# Patient Record
Sex: Female | Born: 1960
Health system: Southern US, Community
[De-identification: ages and names within clinical notes are randomized; demographics above are authoritative.]

## PROBLEM LIST (undated history)

## (undated) DIAGNOSIS — M1990 Unspecified osteoarthritis, unspecified site: Secondary | ICD-10-CM

## (undated) DIAGNOSIS — E669 Obesity, unspecified: Secondary | ICD-10-CM

## (undated) DIAGNOSIS — E079 Disorder of thyroid, unspecified: Secondary | ICD-10-CM

## (undated) DIAGNOSIS — G4730 Sleep apnea, unspecified: Secondary | ICD-10-CM

## (undated) DIAGNOSIS — E039 Hypothyroidism, unspecified: Secondary | ICD-10-CM

## (undated) DIAGNOSIS — E878 Other disorders of electrolyte and fluid balance, not elsewhere classified: Secondary | ICD-10-CM

## (undated) DIAGNOSIS — K219 Gastro-esophageal reflux disease without esophagitis: Secondary | ICD-10-CM

## (undated) HISTORY — DX: Disorder of thyroid, unspecified: E07.9

## (undated) HISTORY — DX: Sleep apnea, unspecified: G47.30

## (undated) HISTORY — DX: Unspecified osteoarthritis, unspecified site: M19.90

## (undated) HISTORY — DX: Obesity, unspecified: E66.9

---

## 1997-09-06 ENCOUNTER — Inpatient Hospital Stay (HOSPITAL_COMMUNITY): Admission: AD | Admit: 1997-09-06 | Discharge: 1997-09-06 | Payer: Self-pay | Admitting: Obstetrics and Gynecology

## 1997-09-13 ENCOUNTER — Inpatient Hospital Stay (HOSPITAL_COMMUNITY): Admission: AD | Admit: 1997-09-13 | Discharge: 1997-09-13 | Payer: Self-pay | Admitting: Obstetrics and Gynecology

## 1997-09-17 ENCOUNTER — Other Ambulatory Visit: Admission: RE | Admit: 1997-09-17 | Discharge: 1997-09-17 | Payer: Self-pay | Admitting: Obstetrics and Gynecology

## 1997-09-17 ENCOUNTER — Observation Stay (HOSPITAL_COMMUNITY): Admission: AD | Admit: 1997-09-17 | Discharge: 1997-09-17 | Payer: Self-pay | Admitting: Obstetrics and Gynecology

## 1997-10-02 ENCOUNTER — Ambulatory Visit (HOSPITAL_COMMUNITY): Admission: RE | Admit: 1997-10-02 | Discharge: 1997-10-02 | Payer: Self-pay | Admitting: Obstetrics and Gynecology

## 1997-10-08 ENCOUNTER — Inpatient Hospital Stay (HOSPITAL_COMMUNITY): Admission: AD | Admit: 1997-10-08 | Discharge: 1997-10-10 | Payer: Self-pay | Admitting: Obstetrics and Gynecology

## 1997-10-11 ENCOUNTER — Inpatient Hospital Stay (HOSPITAL_COMMUNITY): Admission: AD | Admit: 1997-10-11 | Discharge: 1997-10-11 | Payer: Self-pay | Admitting: Obstetrics and Gynecology

## 1998-03-20 ENCOUNTER — Inpatient Hospital Stay (HOSPITAL_COMMUNITY): Admission: AD | Admit: 1998-03-20 | Discharge: 1998-03-23 | Payer: Self-pay | Admitting: Obstetrics & Gynecology

## 1998-03-21 ENCOUNTER — Encounter: Payer: Self-pay | Admitting: Obstetrics & Gynecology

## 2003-01-23 ENCOUNTER — Ambulatory Visit (HOSPITAL_COMMUNITY): Admission: RE | Admit: 2003-01-23 | Discharge: 2003-01-23 | Payer: Self-pay | Admitting: Internal Medicine

## 2006-10-24 ENCOUNTER — Ambulatory Visit (HOSPITAL_COMMUNITY): Admission: RE | Admit: 2006-10-24 | Discharge: 2006-10-24 | Payer: Self-pay | Admitting: Internal Medicine

## 2007-09-28 ENCOUNTER — Other Ambulatory Visit: Admission: RE | Admit: 2007-09-28 | Discharge: 2007-09-28 | Payer: Self-pay | Admitting: Obstetrics and Gynecology

## 2008-04-29 ENCOUNTER — Ambulatory Visit (HOSPITAL_COMMUNITY): Admission: RE | Admit: 2008-04-29 | Discharge: 2008-04-29 | Payer: Self-pay | Admitting: Family Medicine

## 2008-10-03 ENCOUNTER — Other Ambulatory Visit: Admission: RE | Admit: 2008-10-03 | Discharge: 2008-10-03 | Payer: Self-pay | Admitting: Obstetrics and Gynecology

## 2009-01-29 ENCOUNTER — Ambulatory Visit (HOSPITAL_COMMUNITY): Admission: RE | Admit: 2009-01-29 | Discharge: 2009-01-29 | Payer: Self-pay | Admitting: Obstetrics & Gynecology

## 2009-01-31 ENCOUNTER — Emergency Department (HOSPITAL_COMMUNITY): Admission: EM | Admit: 2009-01-31 | Discharge: 2009-01-31 | Payer: Self-pay | Admitting: Emergency Medicine

## 2010-05-30 IMAGING — US US TRANSVAGINAL NON-OB
1 series · 14 of 25 positions shown · non-contrast
Comparison: None

CLINICAL DATA: Post menopausal bleeding.

TRANSABDOMINAL AND TRANSVAGINAL ULTRASOUND OF PELVIS
TECHNIQUE: Both transabdominal and transvaginal ultrasound
examinations of the pelvis were performed including evaluation of
the uterus, ovaries, adnexal regions, and pelvic cul-de-sac.

[Series 1: us transvaginal non-ob · 0.32mm/px · 14 of 29 slices shown]
[im 1/29]
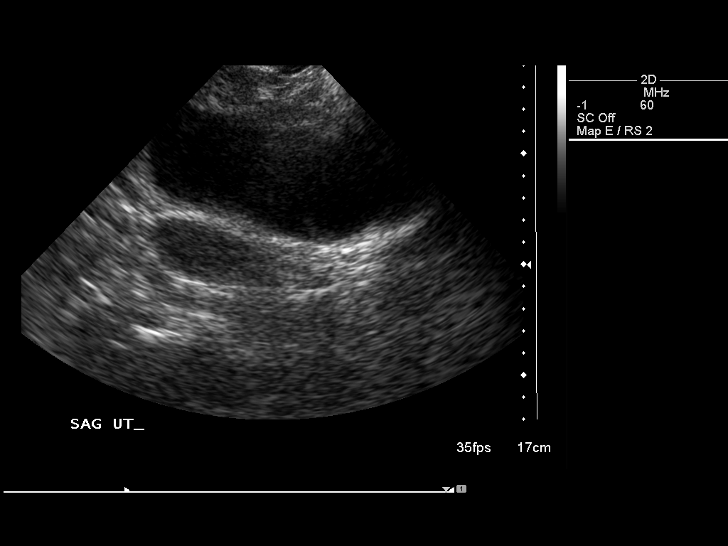
[im 3/29]
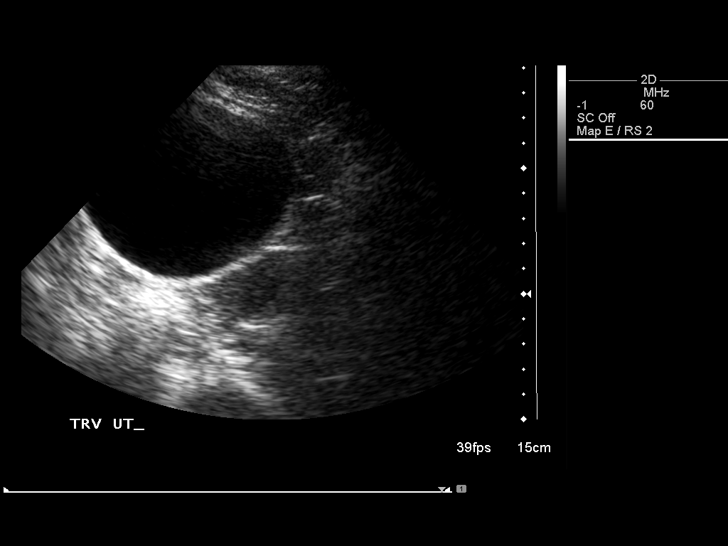
[im 5/29]
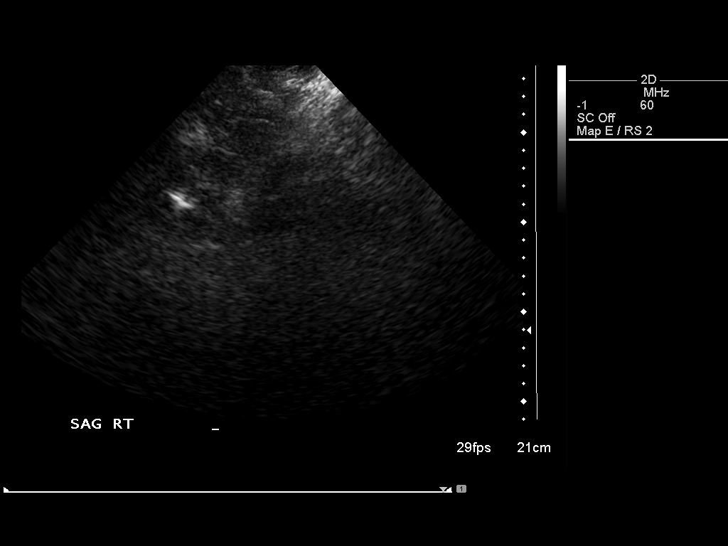
[im 8/29]
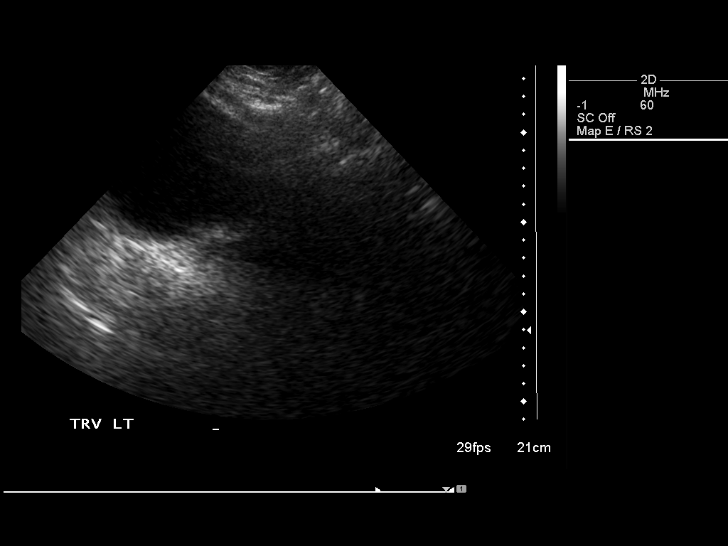
[im 10/29]
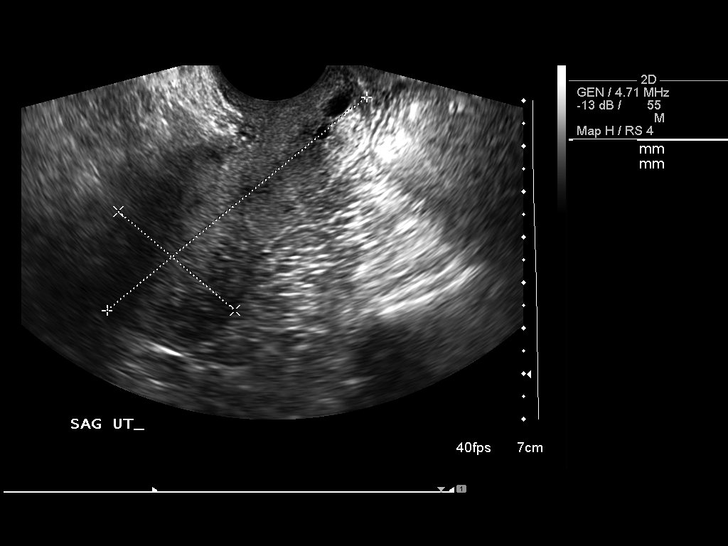
[im 11/29]
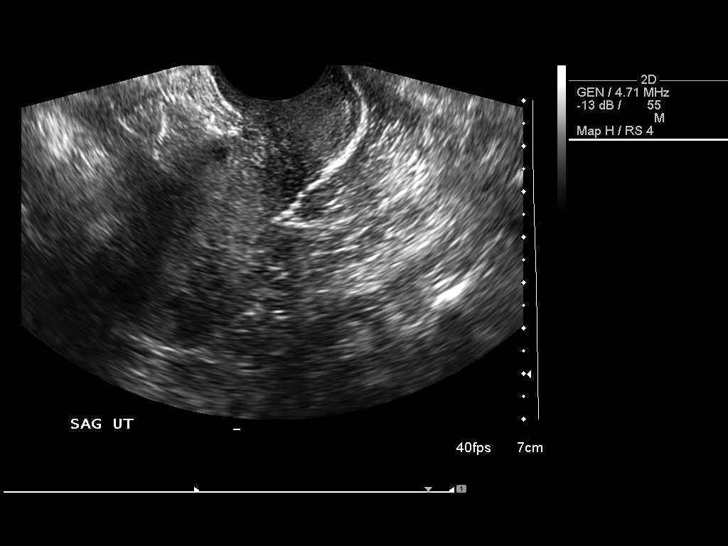
[im 13/29]
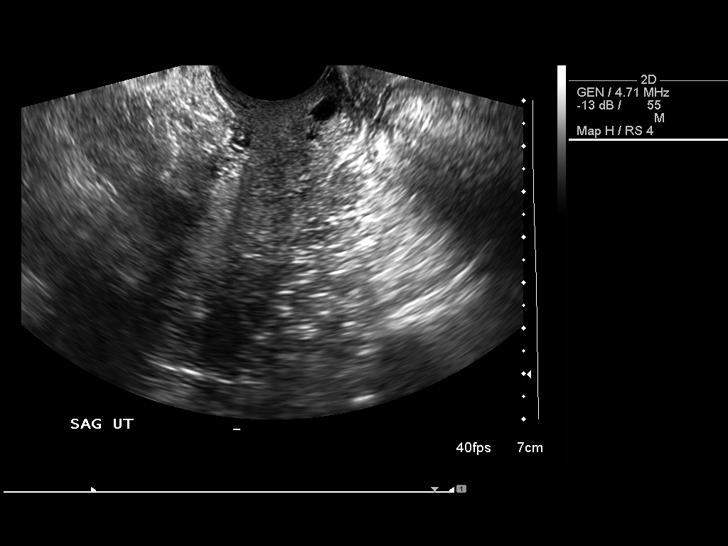
[im 16/29]
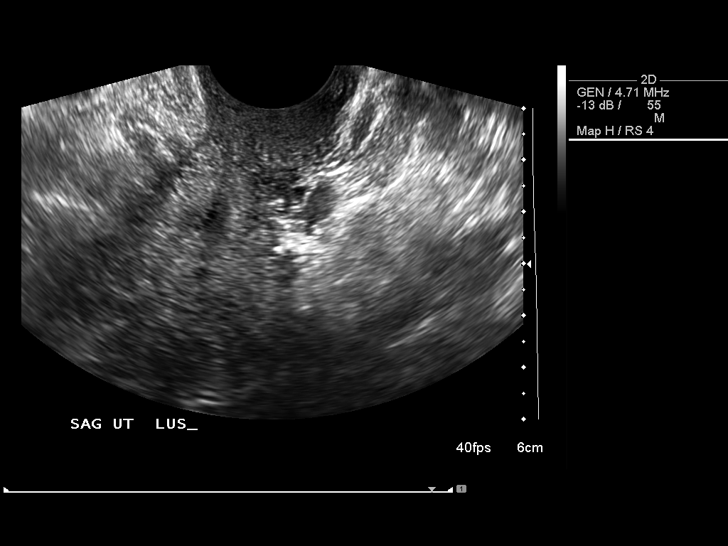
[im 18/29]
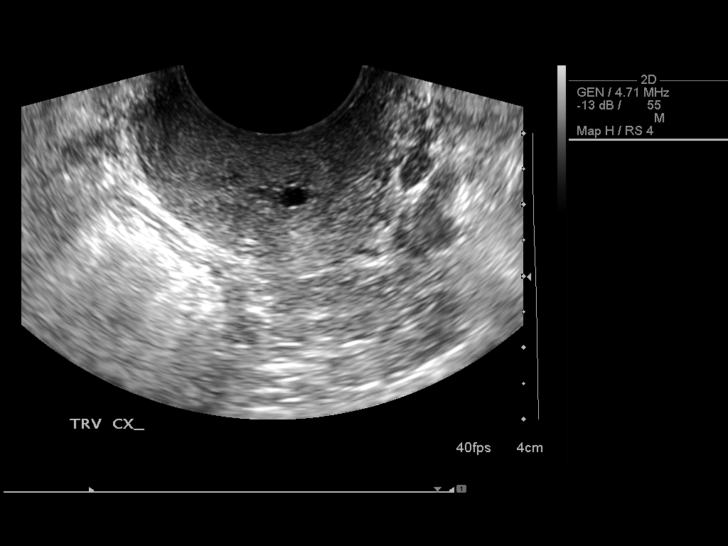
[im 19/29]
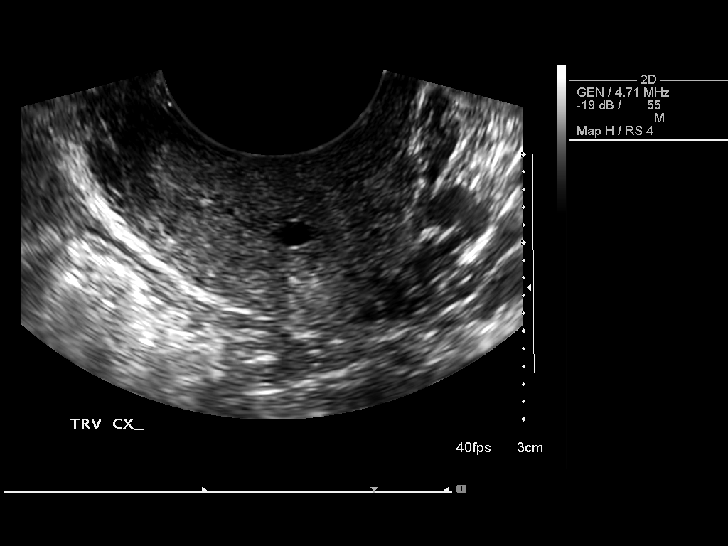
[im 22/29]
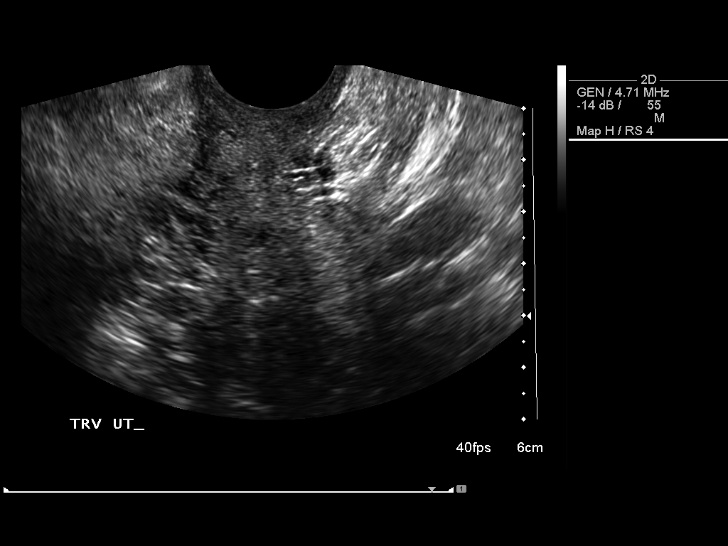
[im 24/29]
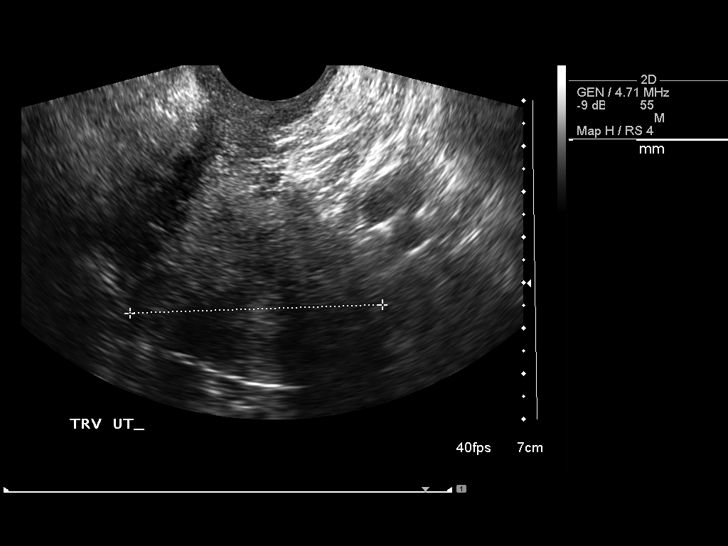
[im 26/29]
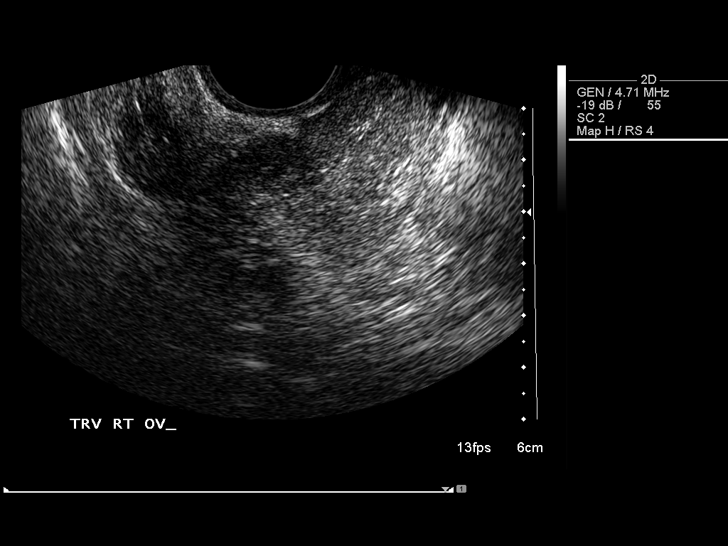
[im 29/29]
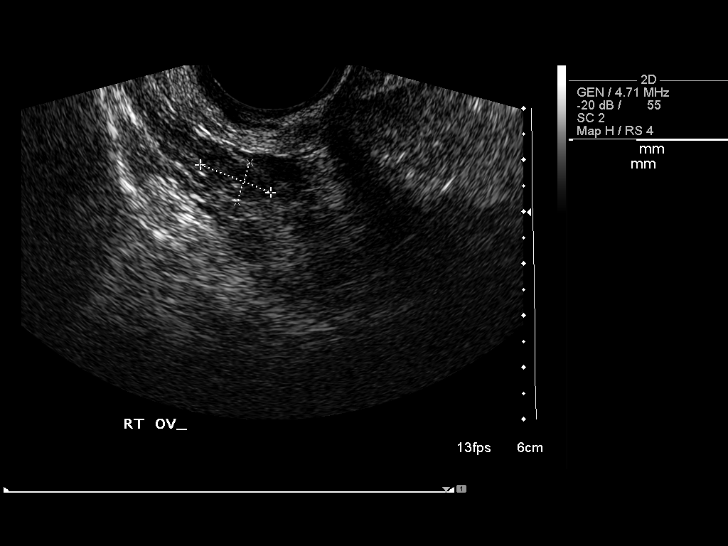

[14 of 25 positions shown; findings below may reference images not displayed]

FINDINGS: Uterus measures 7.4 cm in length and 3.4 x 5.5 cm transversely.
There is is retroverted.  Inhomogeneous myometrial echotexture but
no distinct uterine mass.

Endometrium measures 0.7 cm in thickness.  Small amount of fluid in
the endocervical canal incidentally.

Right Ovary measures 1.4 x 0.8 x 1.4 cm.  Limited visualization of
the right ovary.

Left Ovary cannot be visualized.

Other Findings:  Very small amount of free pelvic fluid.
IMPRESSION: Unremarkable uterus.  No thickening of the endometrial complex.
Negative right ovary.  Left ovary cannot be visualized.

## 2010-09-23 ENCOUNTER — Other Ambulatory Visit: Payer: Self-pay | Admitting: Adult Health

## 2010-09-23 ENCOUNTER — Other Ambulatory Visit: Payer: Self-pay | Admitting: Obstetrics & Gynecology

## 2010-09-23 ENCOUNTER — Other Ambulatory Visit (HOSPITAL_COMMUNITY)
Admission: RE | Admit: 2010-09-23 | Discharge: 2010-09-23 | Disposition: A | Discharge status: Home / Self Care | Payer: 59 | Source: Ambulatory Visit | Attending: Obstetrics and Gynecology | Admitting: Obstetrics and Gynecology

## 2010-09-23 DIAGNOSIS — N950 Postmenopausal bleeding: Secondary | ICD-10-CM

## 2010-09-23 DIAGNOSIS — Z01419 Encounter for gynecological examination (general) (routine) without abnormal findings: Secondary | ICD-10-CM | POA: Insufficient documentation

## 2010-09-25 ENCOUNTER — Other Ambulatory Visit: Payer: Self-pay | Admitting: Obstetrics & Gynecology

## 2010-09-25 ENCOUNTER — Other Ambulatory Visit (HOSPITAL_COMMUNITY): Payer: Self-pay

## 2010-09-25 ENCOUNTER — Ambulatory Visit (HOSPITAL_COMMUNITY)
Admission: RE | Admit: 2010-09-25 | Discharge: 2010-09-25 | Disposition: A | Discharge status: Home / Self Care | Payer: 59 | Source: Ambulatory Visit | Attending: Obstetrics & Gynecology | Admitting: Obstetrics & Gynecology

## 2010-09-25 DIAGNOSIS — R109 Unspecified abdominal pain: Secondary | ICD-10-CM | POA: Insufficient documentation

## 2010-09-25 DIAGNOSIS — N950 Postmenopausal bleeding: Secondary | ICD-10-CM | POA: Insufficient documentation

## 2011-01-26 ENCOUNTER — Encounter: Payer: Self-pay | Admitting: Orthopedic Surgery

## 2011-01-26 ENCOUNTER — Ambulatory Visit (INDEPENDENT_AMBULATORY_CARE_PROVIDER_SITE_OTHER): Payer: 59 | Admitting: Orthopedic Surgery

## 2011-01-26 VITALS — BP 120/72 | Ht 66.0 in | Wt 327.0 lb

## 2011-01-26 DIAGNOSIS — M5020 Other cervical disc displacement, unspecified cervical region: Secondary | ICD-10-CM

## 2011-01-26 MED ORDER — GABAPENTIN 100 MG PO CAPS: 300.0000 mg | ORAL_CAPSULE | Freq: Every day | ORAL | Status: DC

## 2011-01-26 NOTE — Patient Instructions (Signed)
CONTINUE MELOXICAM   START PT X 6 WEEKS   START NEURONTIN 3 TABS AT NIGHT

## 2011-01-26 NOTE — Progress Notes (Signed)
Chief complaint pain in the right arm and index finger radiating down the arm  A 50 year old female complains of pain in her arm for 4 months gradual onset sharp 8/10 in severity.  Denies any trauma.  Associated with numbness and tingling no previous treatment.  We will take an x-ray here.  Past Medical History  Diagnosis Date  . Thyroid disease   . Diabetes mellitus   . Arthritis     History reviewed. No pertinent past surgical history.  Review of systems snoring, numbness, tingling, temperature intolerance  Physical Exam(12) GENERAL: normal development   CDV: pulses are normal   Skin: normal  Lymph: nodes were not palpable/normal  Psychiatric: awake, alert and oriented  Neuro: normal sensation  MSK c spine mild tenderness  1 RIGHT upper extremity evaluation no tenderness normal range of motion in all joints.  Shoulder elbow wrist and hand are stable.  No strength deficit.  Skin is normal.  Pulse and temperature normal.  No lymphadenopathy. 2 Cervical spine mild tenderness with mild motion deficits on rotation.  Cervical spine x-ray shows loss of cervical lordosis without joint space narrowing or osteophyte formation  Impression normal cervical spine with loss of cervical lordosis Assessment: Probable herniated disc no protruding disc    Plan: Medical management   Separate x-ray report AP and lateral cervical spine for pain in the RIGHT upper extremity.  Findings the cervical spine has lost its normal contour. There are large anterior osteophytes at C4 inferior border of C5 as well as C3. C4-C5 of the wrist areas. There is also an anterior ossicle at C5 and 6.  Impression cervical disc disease

## 2011-02-01 ENCOUNTER — Encounter: Payer: Self-pay | Admitting: Orthopedic Surgery

## 2011-02-01 DIAGNOSIS — M5020 Other cervical disc displacement, unspecified cervical region: Secondary | ICD-10-CM | POA: Insufficient documentation

## 2011-02-03 ENCOUNTER — Ambulatory Visit (HOSPITAL_COMMUNITY)
Admission: RE | Admit: 2011-02-03 | Discharge: 2011-02-03 | Disposition: A | Discharge status: Home / Self Care | Payer: 59 | Source: Ambulatory Visit | Attending: Orthopedic Surgery | Admitting: Orthopedic Surgery

## 2011-02-03 DIAGNOSIS — M25519 Pain in unspecified shoulder: Secondary | ICD-10-CM | POA: Insufficient documentation

## 2011-02-03 DIAGNOSIS — IMO0001 Reserved for inherently not codable concepts without codable children: Secondary | ICD-10-CM | POA: Insufficient documentation

## 2011-02-03 DIAGNOSIS — M542 Cervicalgia: Secondary | ICD-10-CM | POA: Insufficient documentation

## 2011-02-03 DIAGNOSIS — E119 Type 2 diabetes mellitus without complications: Secondary | ICD-10-CM | POA: Insufficient documentation

## 2011-02-03 NOTE — Progress Notes (Signed)
Physical Therapy Evaluation  Patient Details  Name: Angela Mccarty MRN: [id number] Date of Birth: 1960/05/01  Today's Date: 02/03/2011 Time: 2130-8657 Time Calculation (min): 47 min Charges: 1 eval Visit#: 1  of 8   Re-eval: 03/05/11 Assessment Diagnosis: cervical pain/R arm pain Next MD Visit: 03/17/10 Prior Therapy: None  Past Medical History:  Past Medical History  Diagnosis Date  . Thyroid disease   . Diabetes mellitus   . Arthritis    Past Surgical History: No past surgical history on file.  Subjective Symptoms/Limitations Symptoms: Pt is a 50 year old female who is referred to PT secondary to neck pain which is causing R arm weakness and pain.  Pt reports that it started about 4-5 months ago when she was working at [employer].  Her c/co is radicular pain to her first finger.  She reports that she went to the onsite nurse who diagnosed her with tendonitis and gave her bio-freeze and ibuprofen and sent her on her way.  She decided to seek advice from her PCP Dr. Phillips Odor who then referred her to Dr. Romeo Apple.  She reports that her job requires lifting boxes all day which weight about 10-15lbs and pulling heavy equipment.  She works 6-7 days a week for 8 hours a day.  She works 1st shift. She is taking Tylenol to help her pain without much relief.  She reports that Dr. Romeo Apple prescribed her medication to help relieve her nerve pain. Aggravating Symptoms: Worse at night when she is sleeping on her side, and when she first starts at work.  Alleviating Symptoms: Laying flat on her back.  She reports that most of her pain in on the right side of her neck with some midline tenderness.  Pt reports she is right handed.  Denies nausea, vomiting or dizziness. PMH: DM II, obesity Limitations: Sitting;Reading Special Tests: - Bakody's + Compression, + Distraction.  Pain Assessment Currently in Pain?: Yes Pain Score:   7 Pain Location: Neck Pain Orientation: Right Pain Type: Acute  pain   02/03/11 1700 Assessment Diagnosis cervical pain/R arm pain Next MD Visit 03/17/10 Prior Therapy None Home Living Lives With Spouse;Daughter Prior Function Level of Independence Independent with basic ADLs;Independent with gait Driving Yes Vocation Full time employment Vocation Requirements 6-7 days a week for 40 hours  Leisure Hobbies-yes (Comment) Comments Enjoys singing, playing on her iPad,  Palpation: Increaed pain and tenderness with significant spasm to R UT and scalene musculature and L UT.  RUE AROM (degrees) Right Shoulder Flexion  0-170 150 Degrees Right Shoulder ABduction 0-140 90 Degrees Right Shoulder Internal Rotation  0-70 70 Degrees Right Shoulder External Rotation  0-90 90 Degrees RUE Strength RUE Overall Strength Comments Strength 4/5 for shoulder motions, except 3/5 for R rhomboid  Cervical AROM Cervical Flexion WNL - mild pain Cervical - Right Side Bend 15 cm - increased pain Cervical - Left Side Bend 14 cm - no pain Cervical - Right Rotation 17 cm - increased pain Cervical - Left Rotation 12 cm - no pain  Cervical Strength Overall Cervical - all cervical strength was strong and painless  Capital flexion   4/5  Cervical Flexion 4/5 Cervical Extension 5/5 Cervical - Right Side Bend 3+/5 Cervical - Left Side Bend 4/5 Cervical - Right Rotation 4/5 Cervical - Left Rotation 3+/5 Posture/Postural Control Posture/Postural Control Postural limitations Postural Limitations slouched posture.  Upper cross syndrome.    Exercise/Treatments Stretches Upper Trapezius Stretch: 30 seconds Levator Stretch: 30 seconds Corner Stretch: 30 seconds  Neck Exercises Neck Retraction: 10 reps Scapular Retraction: 10 reps  Physical Therapy Assessment and Plan PT Assessment and Plan Clinical Impression Statement: Pt is a 50 year old female referred to PT secondary to neck pain with RUE radiculopathy.  After examination it was found that she has current body structure  impairrments including increaed R UE numbess, increaed pain, decreased cervical ROM, decreased cervical and R shoulder strength, increased cervical  and scapular spasms and impaired posture which are limiting her ability to participate in household and work related activities.  She will benefit from skilled OPPT in order to address the above impairments in order to maximize independence.  Rehab Potential: Good PT Frequency: Min 2X/week PT Duration: 6 weeks PT Treatment/Interventions: Therapeutic exercise;Neuromuscular re-education;Patient/family education;Other (comment) (Modalities and manual techniques to decrease pain.) PT Plan: Add: UBE for posture, t-band, x-v, w back, manual as needed for pain control, Cervical traction    Goals Home Exercise Program Pt will Perform Home Exercise Program: Independently PT Short Term Goals Time to Complete Short Term Goals: 2 weeks PT Short Term Goal 1: Pt will report pain less than or equal to 4/10 for 50% of the day.  PT Short Term Goal 2: Pt will report sleeping 5-6 hours without waking from pain.  PT Short Term Goal 3: Pt will report a 50% decrease raidicular pain to RUE.  PT Short Term Goal 4: Pt will present with mild spasms to scapular region.  PT Short Term Goal 5: Pt will improve shoulder ROM to WNL.  PT Long Term Goals Time to Complete Long Term Goals: Other (comment) (6 weeks) PT Long Term Goal 1: Pt will report pain less than or equal to 2/10 for 75% of her day while at work.  PT Long Term Goal 2: Pt will improve her cervical ROM to WNL in order to safely turn her head while driving.  Long Term Goal 3: Pt will report a 75% decrease in radicular pain to R UE for improved quality of life.  Long Term Goal 4: Pt will improve postural strength/cervical strength to WNL in order to sit with appropriate posture during an entire therapy regime without cueing.   Problem List Patient Active Problem List  Diagnoses  . HNP (herniated nucleus pulposus),  cervical  . Cervical pain    PT - End of Session Activity Tolerance: Patient tolerated treatment well   Liviana Mills 02/03/2011, 6:31 PM  Physician Documentation Your signature is required to indicate approval of the treatment plan as stated above.  Please sign and either send electronically or make a copy of this report for your files and return this physician signed original.   Please mark one 1.__approve of plan  2. ___approve of plan with the following conditions.   ______________________________                                                          _____________________ Physician Signature  Date  

## 2011-02-08 NOTE — Progress Notes (Signed)
*  Letter of Medical Necessity sent on 02/08/11 via fax.*   To: [employer] Fax: [phone] ATTN: Thurston Hole  From: Annett Fabian, PT [facility] Phone: [phone] Fax: [phone] Re: Letter of Medical Necessity   To whom it may concern:  Ms. Angela Mccarty received an initial evaluation for physical therapy on 02/03/11 in regards to a herniated cervical disc w/RUE radiculopathy.  After examination it was found that she has current body structure impairments including increased R UE numbness, increased pain, decreased cervical ROM, decreased cervical and R shoulder strength, increased cervical  and scapular spasms and impaired posture which are limiting her ability to participate in household and work related activities.  She will benefit from skilled OPPT in order to address the above impairments in order to improve her overall function at home and with work related activities.   If you have any questions, please call: [phone].  Thank you, Annett Fabian, PT

## 2011-02-09 ENCOUNTER — Ambulatory Visit (HOSPITAL_COMMUNITY)
Admission: RE | Admit: 2011-02-09 | Discharge: 2011-02-09 | Disposition: A | Discharge status: Home / Self Care | Payer: 59 | Source: Ambulatory Visit | Attending: Physical Therapy | Admitting: Physical Therapy

## 2011-02-09 NOTE — Progress Notes (Signed)
Physical Therapy Treatment Patient Details  Name: Angela Mccarty MRN: [id number] Date of Birth: 07-Aug-1960  Today's Date: 02/09/2011 Time: 2130-8657 Time Calculation (min): 26 min Visit#: 2  of 8   Re-eval: 03/05/11 Charges:  therex 24'    Subjective: Symptoms/Limitations Symptoms: Pt. reports she has not had any pain for a couple of days.  Reports compliance with HEP. Pain Assessment Currently in Pain?: No/denies   Exercise/Treatments Stretches Upper Trapezius Stretch: 3 reps;30 seconds Levator Stretch: 3 reps;30 seconds Corner Stretch: 3 reps;30 seconds Neck Exercises Neck Retraction: 15 reps Shoulder Extension: 10 reps;Theraband Theraband Level (Shoulder Extension): Level 3 (Green) Row: 10 reps;Theraband Theraband Level (Row): Level 3 (Green) Scapular Retraction: 10 reps;Theraband Theraband Level (Scapular Retraction): Level 3 (Green) W Back: 10 reps X to V: 10 reps  Physical Therapy Assessment and Plan PT Assessment and Plan Clinical Impression Statement: Pt. displays good for with all previously instructed exercises.  Required cues to perform tband exercises correctly.  Overall progressing well with decreased pain.  Explained to patient we would add modalities (i.e. traction) as needed according to pain. PT Plan: Progress reps with therex; continue to progress posture.     Problem List Patient Active Problem List  Diagnoses  . HNP (herniated nucleus pulposus), cervical  . Cervical pain    PT - End of Session Activity Tolerance: Patient tolerated treatment well General Behavior During Session: [facility] for tasks performed Cognition: [facility] for tasks performed  Emeline Gins B 02/09/2011, 5:13 PM

## 2011-02-11 ENCOUNTER — Ambulatory Visit (HOSPITAL_COMMUNITY)
Admission: RE | Admit: 2011-02-11 | Discharge: 2011-02-11 | Disposition: A | Discharge status: Home / Self Care | Payer: 59 | Source: Ambulatory Visit | Attending: Orthopedic Surgery | Admitting: Orthopedic Surgery

## 2011-02-11 NOTE — Progress Notes (Signed)
Physical Therapy Treatment Patient Details  Name: Angela Mccarty MRN: [id number] Date of Birth: 1960-05-05  Today's Date: 02/11/2011 Time: 4098-1191 Time Calculation (min): 27 min Visit#: 3  of 8   Re-eval: 03/05/11 Charges:  therex 25'    Subjective: Symptoms/Limitations Symptoms: Pain free today. Pain Assessment Currently in Pain?: No/denies  Exercise/Treatments Stretches Upper Trapezius Stretch: Other (comment) (HEP) Levator Stretch: Other (comment) (HEP) Corner Stretch: 3 reps;30 seconds Neck Exercises Neck Retraction: 15 reps Shoulder Extension: 15 reps Theraband Level (Shoulder Extension): Level 3 (Green) Row: 15 reps Theraband Level (Row): Level 3 (Green) Scapular Retraction: 15 reps Theraband Level (Scapular Retraction): Level 3 (Green) W Back: 15 reps X to V: 15 reps Additional Neck Exercises Wall Pushups/Modified Pushups: 15 reps UBE (Upper Arm Bike): 4' @ 2.0 backwards     Physical Therapy Assessment and Plan PT Assessment and Plan Clinical Impression Statement: Pt. given tband and written instructions for HEP.  Pt. with improved form with tband exercises today.  Able to increase reps/added wall push-ups without difficulty. PT Plan: Discuss progression with PT; Continue per POC     Problem List Patient Active Problem List  Diagnoses  . HNP (herniated nucleus pulposus), cervical  . Cervical pain    PT - End of Session Activity Tolerance: Patient tolerated treatment well General Behavior During Session: [facility] for tasks performed Cognition: [facility] for tasks performed  Emeline Gins B 02/11/2011, 5:24 PM

## 2011-02-16 ENCOUNTER — Ambulatory Visit (HOSPITAL_COMMUNITY)
Admission: RE | Admit: 2011-02-16 | Discharge: 2011-02-16 | Disposition: A | Discharge status: Home / Self Care | Payer: 59 | Source: Ambulatory Visit | Attending: Orthopedic Surgery | Admitting: Orthopedic Surgery

## 2011-02-16 DIAGNOSIS — M25519 Pain in unspecified shoulder: Secondary | ICD-10-CM | POA: Insufficient documentation

## 2011-02-16 DIAGNOSIS — M542 Cervicalgia: Secondary | ICD-10-CM | POA: Insufficient documentation

## 2011-02-16 DIAGNOSIS — IMO0001 Reserved for inherently not codable concepts without codable children: Secondary | ICD-10-CM | POA: Insufficient documentation

## 2011-02-16 DIAGNOSIS — E119 Type 2 diabetes mellitus without complications: Secondary | ICD-10-CM | POA: Insufficient documentation

## 2011-02-16 NOTE — Progress Notes (Signed)
Physical Therapy Treatment Patient Details  Name: Angela Mccarty MRN: [id number] Date of Birth: 01-May-1960  Today's Date: 02/16/2011 Time: 4098-1191 Time Calculation (min): 38 min Visit#: 4  of 8   Re-eval: 03/05/11  Charge: therex 34 min  Subjective: Symptoms/Limitations Symptoms: feeling much better, pain free today. Pain Assessment Currently in Pain?: No/denies  Objective:   Exercise/Treatments Stretches Upper Trapezius Stretch: 1 rep;30 seconds;Limitations Upper Trapezius Stretch Limitations: reviewed proper tech Neck Exercises Neck Retraction: 15 reps Neck Flexion: Limitations Neck Flexion Limitations: chin tuck head lift prone Shoulder Extension: Prone;10 reps Shoulder Horizontal ABduction: Prone;15 reps Row: Prone;10 reps Scapular Retraction: Prone;10 reps W Back: Prone;10 reps X to V: 15 reps Upper Extremity Lift: Limitations;Seated (Money exercise for posture/ shoulder ER 10 reps) Additional Neck Exercises Wall Pushups/Modified Pushups: 15 reps UBE (Upper Arm Bike): 4' @ 2.0 backwards     Physical Therapy Assessment and Plan PT Assessment and Plan Clinical Impression Statement: Progressed therex to prone for postural strengthening with pillow under hips, pt able to perform all activities with min cueing for proper tech.  Pt required cueing for proper form wtih upper trap stretch.  Reviewed goals secondary to pt. stating she was doing so good,.  Pt reported no pain during full days at work, able to sleep with no awakening , no radicular pain, no palpable scapular spasms.  Slight impairment with ROM and posture. PT Plan: Continue per POC, assess possible early re-eval with PT secondary to doing so good.    Goals Home Exercise Program Pt will Perform Home Exercise Program: Independently PT Short Term Goals Time to Complete Short Term Goals: 2 weeks PT Short Term Goal 1: Pt will report pain less than or equal to 4/10 for 50% of the day.  PT Short Term Goal 1 -  Progress: Met PT Short Term Goal 2: Pt will report sleeping 5-6 hours without waking from pain.  PT Short Term Goal 2 - Progress: Met PT Short Term Goal 3: Pt will report a 50% decrease raidicular pain to RUE.  PT Short Term Goal 3 - Progress: Met PT Short Term Goal 4: Pt will present with mild spasms to scapular region.  PT Short Term Goal 4 - Progress: Met PT Short Term Goal 5: Pt will improve shoulder ROM to WNL.  PT Short Term Goal 5 - Progress: Progressing toward goal PT Long Term Goals Time to Complete Long Term Goals: Other (comment) (6 weeks) PT Long Term Goal 1: Pt will report pain less than or equal to 2/10 for 75% of her day while at work.  PT Long Term Goal 1 - Progress: Met PT Long Term Goal 2: Pt will improve her cervical ROM to WNL in order to safely turn her head while driving.  PT Long Term Goal 2 - Progress: Progressing toward goal Long Term Goal 3: Pt will report a 75% decrease in radicular pain to R UE for improved quality of life.  Long Term Goal 3 Progress: Met Long Term Goal 4: Pt will improve postural strength/cervical strength to WNL in order to sit with appropriate posture during an entire therapy regime without cueing.  Long Term Goal 4 Progress: Progressing toward goal  Problem List Patient Active Problem List  Diagnoses  . HNP (herniated nucleus pulposus), cervical  . Cervical pain    PT - End of Session Activity Tolerance: Patient tolerated treatment well General Behavior During Session: [facility] for tasks performed Cognition: [facility] for tasks performed  Angela Mccarty 02/16/2011, 6:11  PM

## 2011-02-18 ENCOUNTER — Ambulatory Visit (HOSPITAL_COMMUNITY)
Admission: RE | Admit: 2011-02-18 | Discharge: 2011-02-18 | Disposition: A | Discharge status: Home / Self Care | Payer: 59 | Source: Ambulatory Visit | Attending: Family Medicine | Admitting: Family Medicine

## 2011-02-18 NOTE — Progress Notes (Signed)
Physical Therapy Treatment Patient Details  Name: Angela Mccarty MRN: [id number] Date of Birth: 11/09/1960  Today's Date: 02/18/2011 Time: 7829-5621 Time Calculation (min): 34 min Visit#: 5  of 8   Re-eval: 02/26/11 Charges:  therex 33'    Subjective: Symptoms/Limitations Symptoms: Pt is pain free. Pain Assessment Currently in Pain?: No/denies Pain Score: 0-No pain   Exercise/Treatments Stretches Upper Trapezius Stretch: 30 seconds;2 reps Corner Stretch: 3 reps;30 seconds Neck Exercises Neck Retraction: 15 reps;Limitations Neck Retraction Limitations: prone, chin tucks/lifts Shoulder Extension: Prone;10 reps Shoulder Horizontal ABduction: Prone;15 reps Row: Prone;10 reps Scapular Retraction: Prone;10 reps W Back: Prone;10 reps X to V: 15 reps Upper Extremity Lift: Limitations;Seated Additional Neck Exercises Wall Pushups/Modified Pushups: 20 reps UBE (Upper Arm Bike): 4' @ 2.0 backwards    Physical Therapy Assessment and Plan PT Assessment and Plan Clinical Impression Statement: Pt. continues to do well with therapy and reports compliance with HEP.  Able to perform prone exercises with proper positioning without difficulty. PT Plan: Re-eval next week.    Problem List Patient Active Problem List  Diagnoses  . HNP (herniated nucleus pulposus), cervical  . Cervical pain    PT - End of Session Activity Tolerance: Patient tolerated treatment well General Behavior During Session: [facility] for tasks performed Cognition: [facility] for tasks performed  Emeline Gins B 02/18/2011, 5:30 PM

## 2011-02-23 ENCOUNTER — Ambulatory Visit (HOSPITAL_COMMUNITY): Payer: 59 | Admitting: Physical Therapy

## 2011-02-25 ENCOUNTER — Ambulatory Visit (HOSPITAL_COMMUNITY)
Admission: RE | Admit: 2011-02-25 | Discharge: 2011-02-25 | Disposition: A | Discharge status: Home / Self Care | Payer: 59 | Source: Ambulatory Visit | Attending: Family Medicine | Admitting: Family Medicine

## 2011-02-25 NOTE — Progress Notes (Signed)
Physical Therapy Treatment Patient Details  Name: Angela Mccarty MRN: [id number] Date of Birth: 13-Oct-1960  Today's Date: 02/25/2011 Time: 1308-6578 Time Calculation (min): 30 min Visit#: 6  of 8   Re-eval: 02/26/11  Charge: therex 24 min  Subjective: Symptoms/Limitations Symptoms: I'm feeling good, pain free still. Pain Assessment Currently in Pain?: No/denies  Objective:   Exercise/Treatments Stretches Upper Trapezius Stretch: 3 reps;30 seconds Corner Stretch: 3 reps;30 seconds Neck Exercises Neck Retraction: 15 reps;Limitations Neck Retraction Limitations: prone: chin tuck/ head lift Shoulder Extension: Prone;15 reps;Bar weights/barbell Bar Weights/Barbell (Shoulder Extension): 2 lbs Shoulder Horizontal ABduction: Prone;15 reps;Bar weights/barbell;Seated;Both;Limitations Bar Weights/Barbell (Shoulder Horizontal Abduction): 2 lbs Shoulder Horizontal Abduction Limitations: Money motion 20 reps seated Row: Prone;15 reps;Limitations Row Limitations: 2# Scapular Retraction: Prone;15 reps;Limitations Scapular Retraction Limitations: 2# W Back: Prone;15 reps;Weight W Back Weights (lbs): 2# X to V: 20 reps Additional Neck Exercises Wall Pushups/Modified Pushups: 20 reps UBE (Upper Arm Bike): 6' @ 3.0 Plank: 5x 10"     Physical Therapy Assessment and Plan PT Assessment and Plan Clinical Impression Statement: Pt continue to show good form with all therex activities.  Added plank for postural strengthening, pt able to complete with visible UE fatigue and min cueing for proper form.   PT Plan: Re-assess next session for possible D/C to HEP.    Goals Home Exercise Program Pt will Perform Home Exercise Program: Independently PT Short Term Goals Time to Complete Short Term Goals: 2 weeks PT Short Term Goal 1: Pt will report pain less than or equal to 4/10 for 50% of the day.  PT Short Term Goal 2: Pt will report sleeping 5-6 hours without waking from pain.  PT Short  Term Goal 3: Pt will report a 50% decrease raidicular pain to RUE.  PT Short Term Goal 4: Pt will present with mild spasms to scapular region.  PT Short Term Goal 5: Pt will improve shoulder ROM to WNL.  PT Long Term Goals Time to Complete Long Term Goals: Other (comment) (6 weeks) PT Long Term Goal 1: Pt will report pain less than or equal to 2/10 for 75% of her day while at work.  PT Long Term Goal 2: Pt will improve her cervical ROM to WNL in order to safely turn her head while driving.  Long Term Goal 3: Pt will report a 75% decrease in radicular pain to R UE for improved quality of life.  Long Term Goal 4: Pt will improve postural strength/cervical strength to WNL in order to sit with appropriate posture during an entire therapy regime without cueing.   Problem List Patient Active Problem List  Diagnoses  . HNP (herniated nucleus pulposus), cervical  . Cervical pain    PT - End of Session Activity Tolerance: Patient tolerated treatment well General Behavior During Session: [facility] for tasks performed Cognition: [facility] for tasks performed  Juel Burrow 02/25/2011, 5:21 PM

## 2011-02-26 ENCOUNTER — Ambulatory Visit (HOSPITAL_COMMUNITY)
Admission: RE | Admit: 2011-02-26 | Discharge: 2011-02-26 | Disposition: A | Discharge status: Home / Self Care | Payer: 59 | Source: Ambulatory Visit | Attending: Family Medicine | Admitting: Family Medicine

## 2011-02-26 NOTE — Progress Notes (Signed)
Physical Therapy Treatment Patient Details  Name: Angela Mccarty MRN: [id number] Date of Birth: 11-17-1960  Today's Date: 02/26/2011 Time: 6578-4696 Time Calculation (min): 38 min Charges: 1 MMT, 35' TE Visit#: 7  of 8   Subjective: Symptoms/Limitations Symptoms: Pt reports that she is 100% better and is doing her exercises at home.  She has been able to continue with work activities without an increase in pain.  She reports sleeping throughout the night without waking from pain.   02/26/11 1800  Functional Tests  Functional Tests Palpation: Without spasm to scapular region   RUE Strength  RUE Overall Strength Comments Strength and ROM WNL  Cervical Strength  Overall Cervical Strength Comments Cervical strength and ROM WNL  Posture/Postural Control  Postural Limitations able to maintain appropriate posture during entire therapy regime    Exercise/Treatments Neck Exercises Neck Retraction: 15 reps Neck Retraction Limitations: prone: chin tuck/ head lift Shoulder Extension: Both;15 reps;Theraband;Prone Theraband Level (Shoulder Extension): Level 3 (Green) Bar Weights/Barbell (Shoulder Extension): 2 lbs Shoulder Horizontal ABduction: Both;15 reps;Theraband;Prone Theraband Level (Shoulder Horizontal Abduction): Level 3 (Green) Bar Weights/Barbell (Shoulder Horizontal Abduction): 2 lbs Row: Both;15 reps;Theraband;Prone Theraband Level (Row): Level 3 (Green) Row Limitations: 2# W Back: Standing;10 reps X to V: Standing;10 reps Additional Neck Exercises UBE (Upper Arm Bike): 6' @ 3.0    Physical Therapy Assessment and Plan PT Assessment and Plan Clinical Impression Statement: [name] has attended 7 OPPT visits and has met all of her STG and LTG.  She reports she is 100% better and is able to sleep throughout the night, work without pain, and denies radicular pain.  She does not present with any muscular spams, cervical and shoulder ROM WNL, and cervical and shoulder strength  WFL.  She will continue to benefit from an advanced HEP at home to maintain current condition.  PT Plan: D/C w/advanced HEP    Goals Home Exercise Program Pt will Perform Home Exercise Program: Independently PT Goal: Perform Home Exercise Program - Progress: Met PT Short Term Goals Time to Complete Short Term Goals: 2 weeks PT Short Term Goal 1: Pt will report pain less than or equal to 4/10 for 50% of the day.  PT Short Term Goal 1 - Progress: Met PT Short Term Goal 2: Pt will report sleeping 5-6 hours without waking from pain.  PT Short Term Goal 2 - Progress: Met PT Short Term Goal 3: Pt will report a 50% decrease raidicular pain to RUE.  PT Short Term Goal 3 - Progress: Met PT Short Term Goal 4: Pt will present with mild spasms to scapular region.  PT Short Term Goal 4 - Progress: Met PT Short Term Goal 5: Pt will improve shoulder ROM to WNL.  PT Short Term Goal 5 - Progress: Met PT Long Term Goals Time to Complete Long Term Goals: Other (comment) (6 weeks) PT Long Term Goal 1: Pt will report pain less than or equal to 2/10 for 75% of her day while at work.  PT Long Term Goal 1 - Progress: Met PT Long Term Goal 2: Pt will improve her cervical ROM to WNL in order to safely turn her head while driving.  PT Long Term Goal 2 - Progress: Met Long Term Goal 3: Pt will report a 75% decrease in radicular pain to R UE for improved quality of life.  Long Term Goal 3 Progress: Met Long Term Goal 4: Pt will improve postural strength/cervical strength to WNL in order to sit with appropriate  posture during an entire therapy regime without cueing.  Long Term Goal 4 Progress: Met  Problem List Patient Active Problem List  Diagnoses  . HNP (herniated nucleus pulposus), cervical  . Cervical pain       Crislyn Willbanks 02/26/2011, 6:04 PM

## 2011-03-02 ENCOUNTER — Ambulatory Visit (HOSPITAL_COMMUNITY): Payer: 59 | Admitting: Physical Therapy

## 2011-03-04 ENCOUNTER — Ambulatory Visit (HOSPITAL_COMMUNITY): Payer: 59 | Admitting: Physical Therapy

## 2011-03-18 ENCOUNTER — Ambulatory Visit (INDEPENDENT_AMBULATORY_CARE_PROVIDER_SITE_OTHER): Payer: 59 | Admitting: Orthopedic Surgery

## 2011-03-18 ENCOUNTER — Encounter: Payer: Self-pay | Admitting: Orthopedic Surgery

## 2011-03-18 VITALS — BP 100/78 | Ht 66.0 in | Wt 333.0 lb

## 2011-03-18 DIAGNOSIS — M5020 Other cervical disc displacement, unspecified cervical region: Secondary | ICD-10-CM

## 2011-03-18 NOTE — Progress Notes (Signed)
Patient ID: Angela Mccarty, female   DOB: [birth year], 51 y.o.   MRN: [id number] A followup for back pain after physical therapy.  No history of trauma.  Doing well with no symptoms.  Exam shows full strength in her legs. Normal reflexes in both legs, and no back tenderness.  Follow up as needed

## 2011-03-18 NOTE — Patient Instructions (Signed)
Normal activity    

## 2011-03-22 ENCOUNTER — Other Ambulatory Visit: Payer: Self-pay | Admitting: Orthopedic Surgery

## 2011-09-23 ENCOUNTER — Other Ambulatory Visit: Payer: Self-pay | Admitting: Adult Health

## 2011-09-23 ENCOUNTER — Other Ambulatory Visit (HOSPITAL_COMMUNITY)
Admission: RE | Admit: 2011-09-23 | Discharge: 2011-09-23 | Disposition: A | Discharge status: Home / Self Care | Payer: 59 | Source: Ambulatory Visit | Attending: Obstetrics and Gynecology | Admitting: Obstetrics and Gynecology

## 2011-09-23 DIAGNOSIS — Z1159 Encounter for screening for other viral diseases: Secondary | ICD-10-CM | POA: Insufficient documentation

## 2011-09-23 DIAGNOSIS — Z01419 Encounter for gynecological examination (general) (routine) without abnormal findings: Secondary | ICD-10-CM | POA: Insufficient documentation

## 2012-06-28 ENCOUNTER — Other Ambulatory Visit: Payer: Self-pay | Admitting: Adult Health

## 2012-06-30 ENCOUNTER — Other Ambulatory Visit: Payer: Self-pay | Admitting: Adult Health

## 2012-08-09 ENCOUNTER — Other Ambulatory Visit: Payer: Self-pay | Admitting: Adult Health

## 2012-08-09 MED ORDER — HYDROCHLOROTHIAZIDE 25 MG PO TABS: 25.0000 mg | ORAL_TABLET | Freq: Every day | ORAL | Status: DC

## 2012-11-13 ENCOUNTER — Other Ambulatory Visit: Payer: Self-pay | Admitting: Adult Health

## 2013-02-14 ENCOUNTER — Encounter (INDEPENDENT_AMBULATORY_CARE_PROVIDER_SITE_OTHER): Payer: Self-pay

## 2013-02-14 ENCOUNTER — Ambulatory Visit (INDEPENDENT_AMBULATORY_CARE_PROVIDER_SITE_OTHER): Payer: 59 | Admitting: Adult Health

## 2013-02-14 ENCOUNTER — Encounter: Payer: Self-pay | Admitting: Adult Health

## 2013-02-14 VITALS — BP 138/76 | HR 78 | Ht 65.0 in | Wt 340.0 lb

## 2013-02-14 DIAGNOSIS — Z1212 Encounter for screening for malignant neoplasm of rectum: Secondary | ICD-10-CM

## 2013-02-14 DIAGNOSIS — Z139 Encounter for screening, unspecified: Secondary | ICD-10-CM

## 2013-02-14 DIAGNOSIS — E669 Obesity, unspecified: Secondary | ICD-10-CM

## 2013-02-14 DIAGNOSIS — Z01419 Encounter for gynecological examination (general) (routine) without abnormal findings: Secondary | ICD-10-CM

## 2013-02-14 LAB — HEMOCCULT GUIAC POC 1CARD (OFFICE): Fecal Occult Blood, POC: NEGATIVE

## 2013-02-14 NOTE — Patient Instructions (Signed)
Physical in 1 year Mammogram yearly Labs at PCP Colonoscopy  Advised  Now will refer to Dr Darrick Penna

## 2013-02-14 NOTE — Progress Notes (Signed)
Patient ID: Angela Mccarty, female   DOB: 30-Mar-1960, 52 y.o.   MRN: [id number] History of Present Illness: Angela Mccarty is a 52 year old black female, married in for a physical, she had a normal pap with negative HPV 09/23/11.   Current Medications, Allergies, Past Medical History, Past Surgical History, Family History and Social History were reviewed in [employer] record.   Past Medical History  Diagnosis Date  . Thyroid disease   . Diabetes mellitus   . Arthritis   . Obesity   . Sleep apnea with use of continuous positive airway pressure (CPAP)   History reviewed. No pertinent past surgical history. Current outpatient prescriptions:hydrochlorothiazide (HYDRODIURIL) 25 MG tablet, TAKE ONE TABLET BY MOUTH ONCE DAILY, Disp: 90 tablet, Rfl: 0;  levothyroxine (SYNTHROID, LEVOTHROID) 150 MCG tablet, Take 150 mcg by mouth daily.  , Disp: , Rfl: ;  metFORMIN (GLUCOPHAGE) 500 MG tablet, Take 500 mg by mouth 2 (two) times daily with a meal.  , Disp: , Rfl:  naproxen sodium (ANAPROX) 220 MG tablet, Take 220 mg by mouth 2 (two) times daily with a meal., Disp: , Rfl:   Review of Systems: Patient denies any headaches, blurred vision, shortness of breath, chest pain, abdominal pain, problems with bowel movements, urination, or intercourse.Not having sex, no joint swelling or mood changes, has had some hot flashes.     Physical Exam:BP 138/76  Pulse 78  Ht 5\' 5"  (1.651 m)  Wt 340 lb (154.223 kg)  BMI 56.58 kg/m2 General:  Well developed, well nourished, no acute distress Skin:  Warm and dry Neck:  Midline trachea, normal thyroid Lungs; Clear to auscultation bilaterally Breast:  No dominant palpable mass, retraction, or nipple discharge Cardiovascular: Regular rate and rhythm Abdomen:  Soft, non tender, no hepatosplenomegaly,obese Pelvic:  External genitalia is normal in appearance.  The vagina is normal in appearance.The cervix is bulbous.  Uterus is felt to be normal size,  shape, and contour.  No  adnexal masses or tenderness noted.Difficult exam secondary to abdominal girth Rectal: Good sphincter tone, no polyps, or hemorrhoids felt.  Hemoccult negative. Extremities:  No swelling or varicosities noted Psych:  No mood changes, alert and cooperative, seems happy Declines any meds for hot flashes  Impression: Yearly gyn exam no pap Obesity     Plan: Physical in 1 year Mammogram yearly Labs with PCP Colonoscopy advised will refer to Dr Darrick Penna

## 2013-02-23 ENCOUNTER — Telehealth: Payer: Self-pay

## 2013-02-23 NOTE — Telephone Encounter (Signed)
Pt was referred by Cyril Mourning, NP for screening colonoscopy. Letter was mailed to her on 02/15/2013. LMOM for a return call.

## 2013-02-26 ENCOUNTER — Telehealth: Payer: Self-pay

## 2013-02-26 NOTE — Telephone Encounter (Signed)
Pt was returning call to be triage. [phone]

## 2013-02-27 ENCOUNTER — Telehealth: Payer: Self-pay

## 2013-02-27 NOTE — Telephone Encounter (Signed)
Pt decided to wait and call back after the first of the year to schedule in Jan 2015.

## 2013-02-28 NOTE — Telephone Encounter (Signed)
Pt to call back in Jan to schedule colonoscopy.

## 2013-02-28 NOTE — Telephone Encounter (Signed)
See phone note of 02/27/2013. Pt will call back in Jan to schedule colonoscopy.

## 2013-03-19 ENCOUNTER — Telehealth: Payer: Self-pay

## 2013-03-19 NOTE — Telephone Encounter (Signed)
LMOM to call to schedule colonoscopy.  

## 2013-04-03 ENCOUNTER — Other Ambulatory Visit: Payer: Self-pay | Admitting: Adult Health

## 2013-04-04 ENCOUNTER — Other Ambulatory Visit: Payer: Self-pay | Admitting: *Deleted

## 2013-04-04 MED ORDER — HYDROCHLOROTHIAZIDE 25 MG PO TABS: ORAL_TABLET | ORAL | Status: DC

## 2013-04-10 NOTE — Telephone Encounter (Signed)
Letter mailed to pt to remind her to call to schedule her colonoscopy.

## 2013-04-10 NOTE — Telephone Encounter (Signed)
Pt aware needs to call and schedule colonoscopy

## 2013-06-07 ENCOUNTER — Telehealth: Payer: Self-pay

## 2013-06-07 NOTE — Telephone Encounter (Signed)
Pt [facility] that [facility] that patient contact us for her colonoscopy. Pt is 52 and having no problems. [phone]

## 2013-06-11 NOTE — Telephone Encounter (Signed)
Returned pt's call. LMOM to call to schedule colonoscopy.

## 2013-06-13 ENCOUNTER — Telehealth: Payer: Self-pay

## 2013-06-13 NOTE — Telephone Encounter (Signed)
PT called to be triaged for screening colonoscopy. Said she can only be done if [facility] doctors are working [facility]. If not then, she is not sure when she can be free.

## 2013-06-14 NOTE — Telephone Encounter (Signed)
Pt is aware that the doctors will not be doing procedures on [facility]. She will call when she thinks of another time that she can get away from work to do it.

## 2013-06-14 NOTE — Telephone Encounter (Signed)
Pt requested [facility]. Doctors not doing procedures that day. She will call when she has another day to try to do TCS.

## 2014-01-14 ENCOUNTER — Encounter: Payer: Self-pay | Admitting: Adult Health

## 2014-02-06 ENCOUNTER — Ambulatory Visit (HOSPITAL_COMMUNITY)
Admission: RE | Admit: 2014-02-06 | Discharge: 2014-02-06 | Disposition: A | Discharge status: Home / Self Care | Payer: BC Managed Care – PPO | Source: Ambulatory Visit | Attending: Physician Assistant | Admitting: Physician Assistant

## 2014-02-06 ENCOUNTER — Other Ambulatory Visit (HOSPITAL_COMMUNITY): Payer: Self-pay | Admitting: Physician Assistant

## 2014-02-06 DIAGNOSIS — M5410 Radiculopathy, site unspecified: Secondary | ICD-10-CM | POA: Diagnosis not present

## 2014-02-06 DIAGNOSIS — M1991 Primary osteoarthritis, unspecified site: Secondary | ICD-10-CM

## 2014-06-10 ENCOUNTER — Other Ambulatory Visit: Payer: Self-pay | Admitting: Adult Health

## 2014-11-19 ENCOUNTER — Encounter: Payer: Self-pay | Admitting: Adult Health

## 2014-11-19 ENCOUNTER — Ambulatory Visit (INDEPENDENT_AMBULATORY_CARE_PROVIDER_SITE_OTHER): Payer: BLUE CROSS/BLUE SHIELD | Admitting: Adult Health

## 2014-11-19 ENCOUNTER — Other Ambulatory Visit (HOSPITAL_COMMUNITY)
Admission: RE | Admit: 2014-11-19 | Discharge: 2014-11-19 | Disposition: A | Discharge status: Home / Self Care | Payer: Self-pay | Source: Ambulatory Visit | Attending: Adult Health | Admitting: Adult Health

## 2014-11-19 VITALS — BP 118/72 | HR 60 | Ht 65.0 in | Wt 299.0 lb

## 2014-11-19 DIAGNOSIS — Z1151 Encounter for screening for human papillomavirus (HPV): Secondary | ICD-10-CM | POA: Insufficient documentation

## 2014-11-19 DIAGNOSIS — Z1212 Encounter for screening for malignant neoplasm of rectum: Secondary | ICD-10-CM | POA: Diagnosis not present

## 2014-11-19 DIAGNOSIS — Z01419 Encounter for gynecological examination (general) (routine) without abnormal findings: Secondary | ICD-10-CM

## 2014-11-19 LAB — HEMOCCULT GUIAC POC 1CARD (OFFICE): FECAL OCCULT BLD: NEGATIVE

## 2014-11-19 NOTE — Progress Notes (Signed)
Patient ID: Angela Mccarty, female   DOB: December 15, 1960, 54 y.o.   MRN: [id number] History of Present Illness: Angela Mccarty is a 54 year old black female, recently widowed, in for well woman gyn exam and pap.She lost her Dad in 02/03/23 and then her husband died in 06/03/22.She has 78 year old daughter at home.She is still working at [employer]. PCP is Dr Phillips Odor.   Current Medications, Allergies, Past Medical History, Past Surgical History, Family History and Social History were reviewed in [employer] record.     Review of Systems:  Patient denies any headaches, hearing loss, fatigue, blurred vision, shortness of breath, chest pain, abdominal pain, problems with bowel movements, urination, or intercourse(not having sex). No joint pain or mood swings.   Physical Exam:BP 118/72 mmHg  Pulse 60  Ht  (1.651 m)  Wt 299 lb (135.626 kg)  BMI 49.76 kg/m2She has lost over 50 lbs in last 2 years. General:  Well developed, well nourished, no acute distress Skin:  Warm and dry Neck:  Midline trachea, normal thyroid, good ROM, no lymphadenopathy, has boil upper back on meds from Dr Phillips Odor Lungs; Clear to auscultation bilaterally Breast:  No dominant palpable mass, retraction, or nipple discharge,has very large breasts,gets mammogram at work on mobile unit from [place]: Regular rate and rhythm Abdomen:  Soft, non tender, no hepatosplenomegaly.obese Pelvic:  External genitalia is normal in appearance, no lesions.  The vagina is normal in appearance. Urethra has no lesions or masses. The cervix is bulbous.Pap with HPV performed.  Uterus is felt to be normal size, shape, and contour.  No adnexal masses or tenderness noted.Bladder is non tender, no masses felt. Rectal: Good sphincter tone, no polyps, or hemorrhoids felt.  Hemoccult negative. Extremities/musculoskeletal:  No swelling or varicosities noted, no clubbing or cyanosis Psych:  No mood changes, alert and  cooperative,seems happy   Impression: Well woman gyn exam and pap    Plan: Physical in 1 year, pap in 3 if normal Mammogram yearly Labs with PCP, is getting today Colonoscopy advised again

## 2014-11-19 NOTE — Patient Instructions (Signed)
Physical in 1 year Pap in 3 year if normal Mammogram yearly Colonoscopy advised Labs with PCP

## 2014-11-20 LAB — CYTOLOGY - PAP

## 2015-01-19 ENCOUNTER — Other Ambulatory Visit: Payer: Self-pay | Admitting: Adult Health

## 2015-04-28 ENCOUNTER — Other Ambulatory Visit: Payer: Self-pay | Admitting: Adult Health

## 2015-06-07 IMAGING — CR DG HIP (WITH OR WITHOUT PELVIS) 2-3V*L*
3 series · 3 of 3 positions shown · non-contrast
Comparison: None.

CLINICAL DATA: Left posterior pelvic and left hip pain since [REDACTED] ; the patient reports lifting heavy boxes at 4 but is unsure
if this is the cause of the pain

EXAM:
LEFT HIP - COMPLETE 2+ VIEW

[view not recorded (1 of 3)]
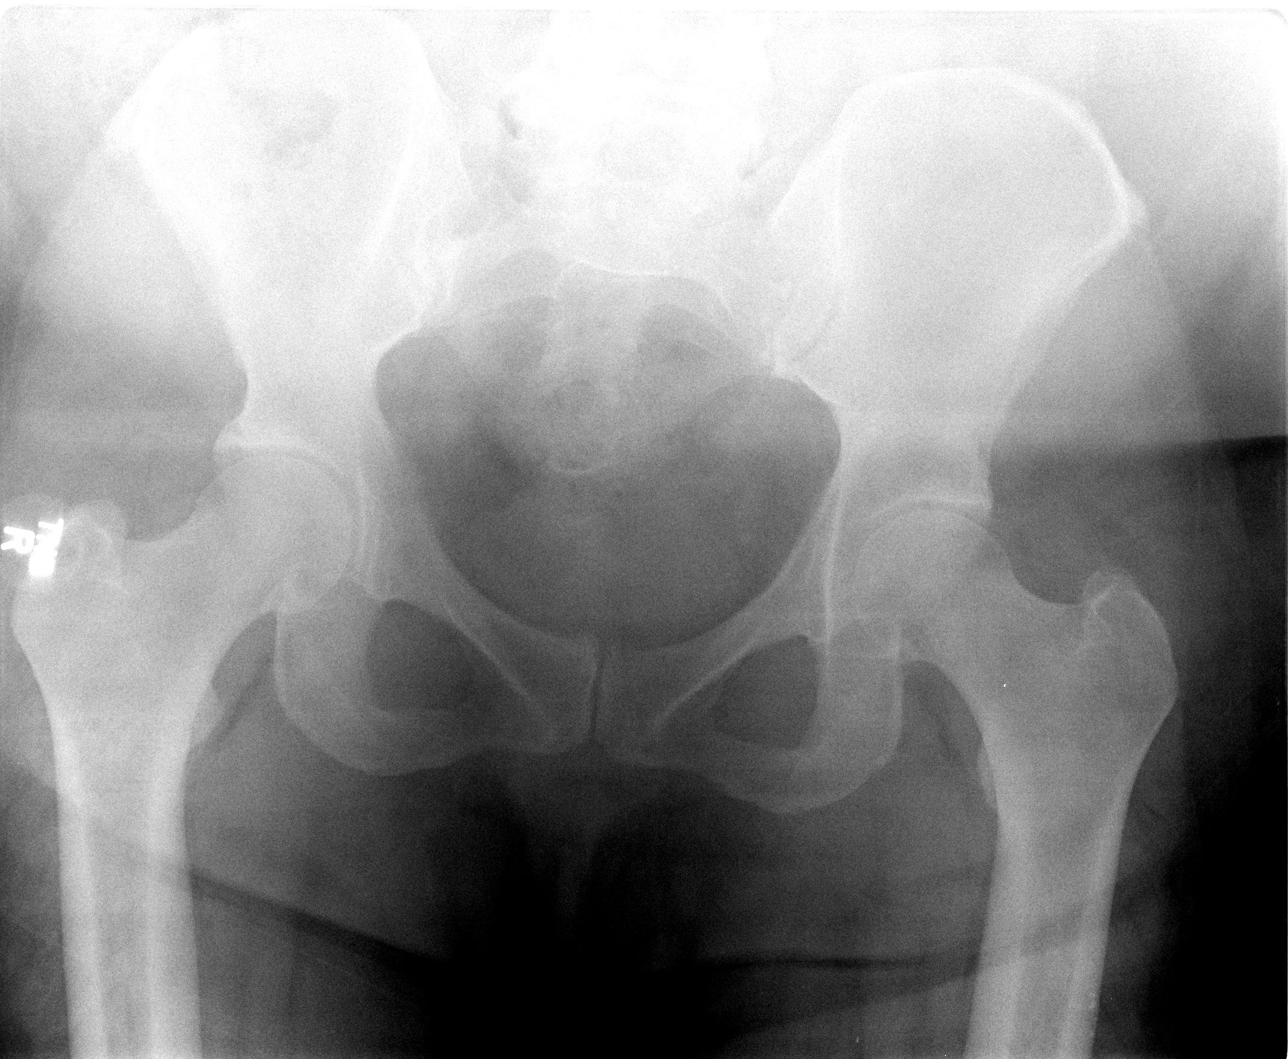

[view not recorded (2 of 3)]
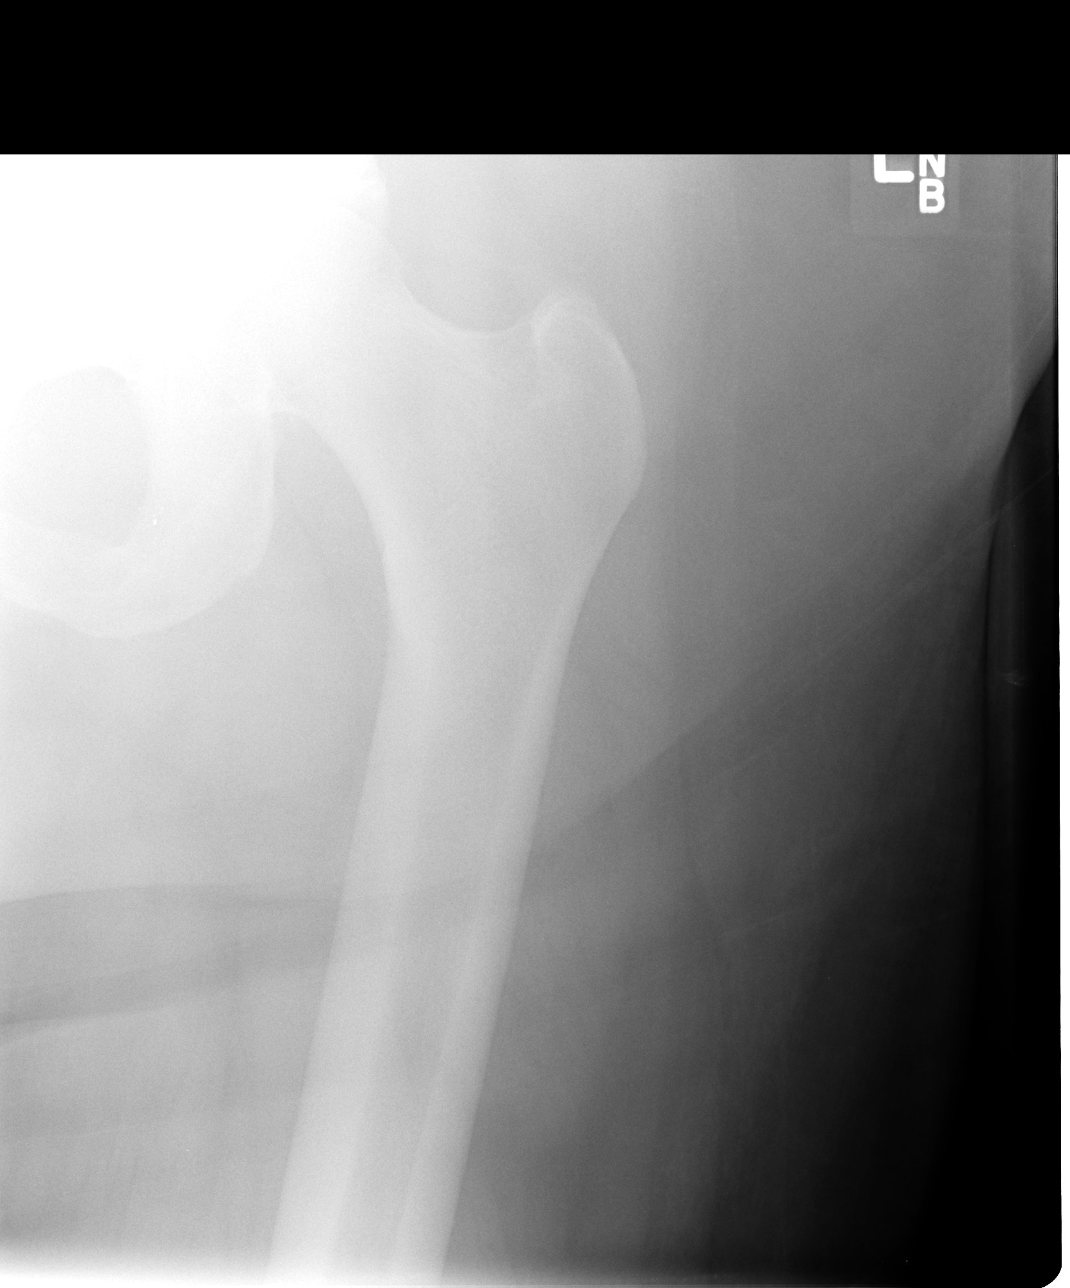

[view not recorded (3 of 3)]
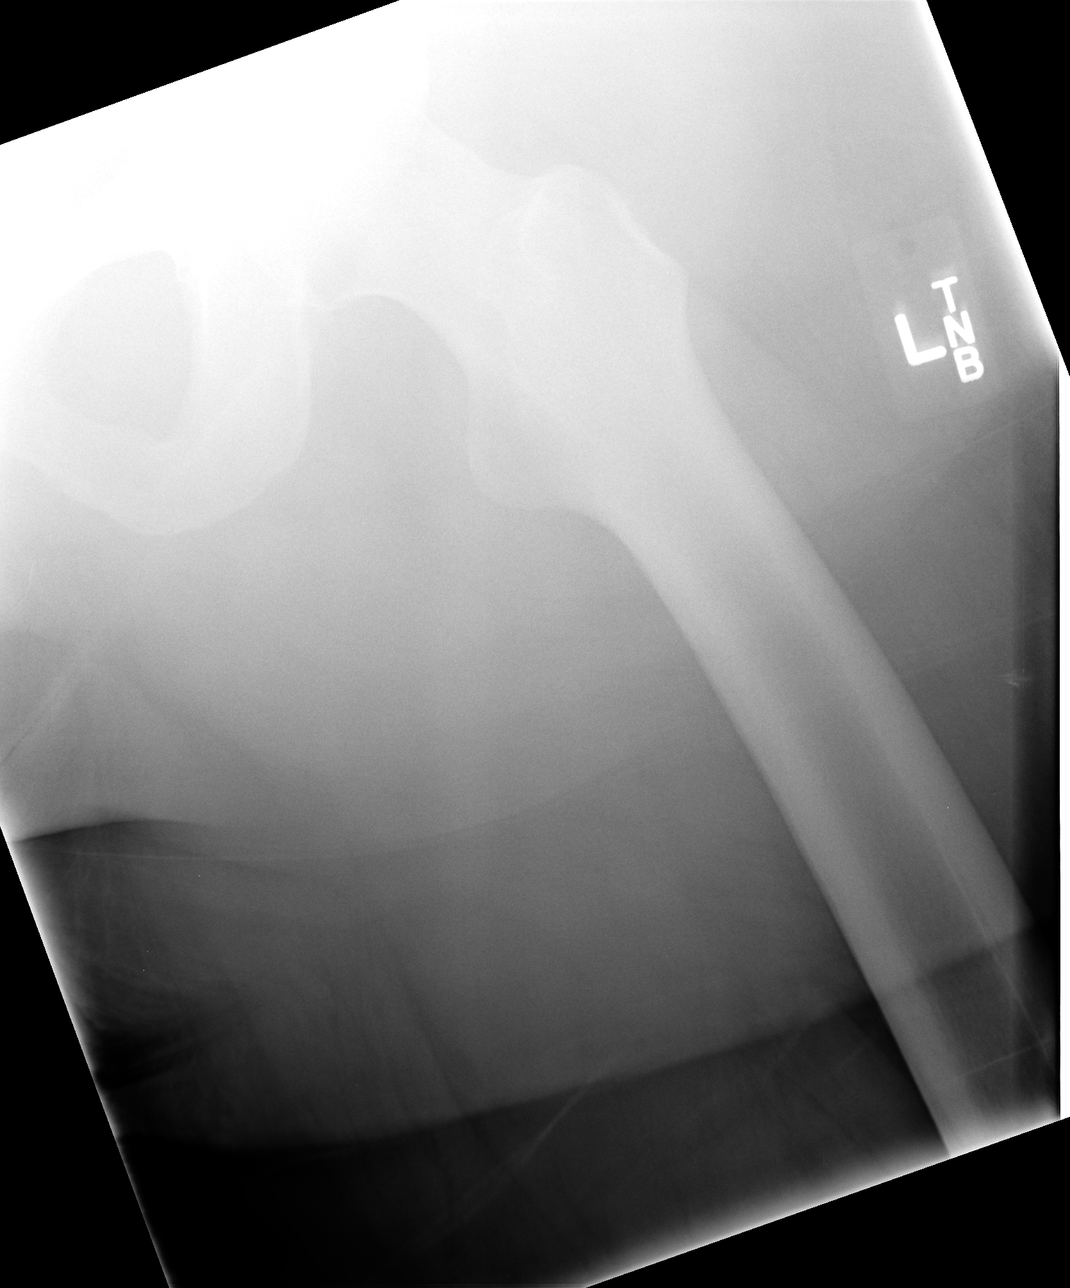

[3 of 3 positions shown; findings below may reference images not displayed]

FINDINGS: The bony pelvis is adequately mineralized. There is no lytic or
blastic lesion nor evidence of an acute or old fracture. There are
degenerative changes of the lower lumbar spine.

The hip joint spaces are reasonably well maintained. AP and frog-leg
lateral views of the left hip reveal no acute abnormality of the
acetabulum or proximal femur.
IMPRESSION: There is no acute bony abnormality of the left hip.

## 2015-11-14 ENCOUNTER — Encounter (HOSPITAL_COMMUNITY): Payer: Self-pay

## 2015-11-14 NOTE — Patient Instructions (Signed)
Angela Mccarty  11/14/2015     @PREFPERIOPPHARMACY @   Your procedure is scheduled on 11/24/2015.  Report to [facility] at 10:00 [facility].M.  Call this number if you have problems the morning of surgery:  [phone]   Remember:  Do not eat food or drink liquids after midnight.  Take these medicines the morning of surgery with A SIP OF WATER : Xanax and Synthroid   Do not wear jewelry, make-up or nail polish.  Do not wear lotions, powders, or perfumes, or deoderant.  Do not shave 48 hours prior to surgery.  Men may shave face and neck.  Do not bring valuables to the hospital.  [facility] is not responsible for any belongings or valuables.  Contacts, dentures or bridgework may not be worn into surgery.  Leave your suitcase in the car.  After surgery it may be brought to your room.  For patients admitted to the hospital, discharge time will be determined by your treatment team.  Patients discharged the day of surgery will not be allowed to drive home.   Name and phone number of your driver:   family Special instructions:  n/a  Please read over the following fact sheets that you were given. Care and Recovery After Surgery      A cataract is a clouding of the lens of the eye. When a lens becomes cloudy, vision is reduced based on the degree and nature of the clouding. Surgery may be needed to improve vision. Surgery removes the cloudy lens and usually replaces it with a substitute lens (intraocular lens, IOL). LET YOUR EYE DOCTOR KNOW ABOUT:  Allergies to food or medicine.  Medicines taken including herbs, eye drops, over-the-counter medicines, and creams.  Use of steroids (by mouth or creams).  Previous problems with anesthetics or numbing medicine.  History of bleeding problems or blood clots.  Previous surgery.  Other health problems, including diabetes and kidney problems.  Possibility of pregnancy, if this applies. RISKS AND  COMPLICATIONS  Infection.  Inflammation of the eyeball (endophthalmitis) that can spread to both eyes (sympathetic ophthalmia).  Poor wound healing.  If an IOL is inserted, it can later fall out of proper position. This is very uncommon.  Clouding of the part of your eye that holds an IOL in place. This is called an "after-cataract." These are uncommon but easily treated. BEFORE THE PROCEDURE  Do not eat or drink anything except small amounts of water for 8 to 12 before your surgery, or as directed by your caregiver.  Unless you are told otherwise, continue any eye drops you have been prescribed.  Talk to your primary caregiver about all other medicines that you take (both prescription and nonprescription). In some cases, you may need to stop or change medicines near the time of your surgery. This is most important if you are taking blood-thinning medicine.Do not stop medicines unless you are told to do so.  Arrange for someone to drive you to and from the procedure.  Do not put contact lenses in either eye on the day of your surgery. PROCEDURE There is more than one method for safely removing a cataract. Your doctor can explain the differences and help determine which is best for you. Phacoemulsification surgery is the most common form of cataract surgery.  An injection is given behind the eye or eye drops are given to make this a painless procedure.  A small cut (incision) is made on the edge of the clear, dome-shaped surface  that covers the front of the eye (cornea).  A tiny probe is painlessly inserted into the eye. This device gives off ultrasound waves that soften and break up the cloudy center of the lens. This makes it easier for the cloudy lens to be removed by suction.  An IOL may be implanted.  The normal lens of the eye is covered by a clear capsule. Part of that capsule is intentionally left in the eye to support the IOL.  Your surgeon may or may not use stitches to  close the incision. There are other forms of cataract surgery that require a larger incision and stitches to close the eye. This approach is taken in cases where the doctor feels that the cataract cannot be easily removed using phacoemulsification. AFTER THE PROCEDURE  When an IOL is implanted, it does not need care. It becomes a permanent part of your eye and cannot be seen or felt.  Your doctor will schedule follow-up exams to check on your progress.  Review your other medicines with your doctor to see which can be resumed after surgery.  Use eye drops or take medicine as prescribed by your doctor.   This information is not intended to replace advice given to you by your health care provider. Make sure you discuss any questions you have with your health care provider.   Document Released: 02/18/2011 Document Revised: 03/22/2014 Document Reviewed: 02/18/2011 Elsevier Interactive Patient Education [employer]2016 Elsevier Inc.

## 2015-11-16 ENCOUNTER — Other Ambulatory Visit: Payer: Self-pay | Admitting: Adult Health

## 2015-11-19 ENCOUNTER — Other Ambulatory Visit: Payer: Self-pay

## 2015-11-19 ENCOUNTER — Encounter (HOSPITAL_COMMUNITY)
Admission: RE | Admit: 2015-11-19 | Discharge: 2015-11-19 | Disposition: A | Discharge status: Home / Self Care | Payer: BLUE CROSS/BLUE SHIELD | Source: Ambulatory Visit | Attending: Ophthalmology | Admitting: Ophthalmology

## 2015-11-19 DIAGNOSIS — Z01810 Encounter for preprocedural cardiovascular examination: Secondary | ICD-10-CM | POA: Diagnosis present

## 2015-11-19 DIAGNOSIS — Z01812 Encounter for preprocedural laboratory examination: Secondary | ICD-10-CM | POA: Diagnosis present

## 2015-11-19 LAB — BASIC METABOLIC PANEL
ANION GAP: 8 (ref 5–15)
BUN: 15 mg/dL (ref 6–20)
CALCIUM: 9.3 mg/dL (ref 8.9–10.3)
CO2: 27 mmol/L (ref 22–32)
Chloride: 106 mmol/L (ref 101–111)
Creatinine, Ser: 0.8 mg/dL (ref 0.44–1.00)
Glucose, Bld: 98 mg/dL (ref 65–99)
Potassium: 3.5 mmol/L (ref 3.5–5.1)
SODIUM: 141 mmol/L (ref 135–145)

## 2015-11-19 LAB — CBC
HEMATOCRIT: 38.7 % (ref 36.0–46.0)
Hemoglobin: 12.3 g/dL (ref 12.0–15.0)
MCH: 29 pg (ref 26.0–34.0)
MCHC: 31.8 g/dL (ref 30.0–36.0)
MCV: 91.3 fL (ref 78.0–100.0)
PLATELETS: 323 10*3/uL (ref 150–400)
RBC: 4.24 MIL/uL (ref 3.87–5.11)
RDW: 14.9 % (ref 11.5–15.5)
WBC: 8.3 10*3/uL (ref 4.0–10.5)

## 2015-11-24 ENCOUNTER — Encounter (HOSPITAL_COMMUNITY): Payer: Self-pay | Admitting: *Deleted

## 2015-11-24 ENCOUNTER — Ambulatory Visit (HOSPITAL_COMMUNITY): Payer: BLUE CROSS/BLUE SHIELD | Admitting: Anesthesiology

## 2015-11-24 ENCOUNTER — Ambulatory Visit (HOSPITAL_COMMUNITY)
Admission: RE | Admit: 2015-11-24 | Discharge: 2015-11-24 | Disposition: A | Discharge status: Home / Self Care | Payer: BLUE CROSS/BLUE SHIELD | Source: Ambulatory Visit | Attending: Ophthalmology | Admitting: Ophthalmology

## 2015-11-24 ENCOUNTER — Encounter (HOSPITAL_COMMUNITY)
Admission: RE | Disposition: A | Discharge status: Home / Self Care | Payer: Self-pay | Source: Ambulatory Visit | Attending: Ophthalmology

## 2015-11-24 DIAGNOSIS — Z6841 Body Mass Index (BMI) 40.0 and over, adult: Secondary | ICD-10-CM | POA: Diagnosis not present

## 2015-11-24 DIAGNOSIS — H25812 Combined forms of age-related cataract, left eye: Secondary | ICD-10-CM | POA: Diagnosis not present

## 2015-11-24 DIAGNOSIS — Z79899 Other long term (current) drug therapy: Secondary | ICD-10-CM | POA: Insufficient documentation

## 2015-11-24 DIAGNOSIS — E119 Type 2 diabetes mellitus without complications: Secondary | ICD-10-CM | POA: Insufficient documentation

## 2015-11-24 DIAGNOSIS — G4730 Sleep apnea, unspecified: Secondary | ICD-10-CM | POA: Insufficient documentation

## 2015-11-24 HISTORY — PX: CATARACT EXTRACTION W/PHACO: SHX586

## 2015-11-24 SURGERY — PHACOEMULSIFICATION, CATARACT, WITH IOL INSERTION
Anesthesia: Monitor Anesthesia Care | Site: Eye | Laterality: Left

## 2015-11-24 MED ORDER — PHENYLEPHRINE HCL 2.5 % OP SOLN
1.0000 [drp] | OPHTHALMIC | Status: AC
  Administered 2015-11-24 (×3): 1 [drp] via OPHTHALMIC

## 2015-11-24 MED ORDER — PROVISC 10 MG/ML IO SOLN
INTRAOCULAR | Status: DC | PRN
  Administered 2015-11-24: 0.85 mL via INTRAOCULAR

## 2015-11-24 MED ORDER — BSS IO SOLN
INTRAOCULAR | Status: DC | PRN
  Administered 2015-11-24: 15 mL

## 2015-11-24 MED ORDER — LACTATED RINGERS IV SOLN
INTRAVENOUS | Status: DC
  Administered 2015-11-24: 11:00:00 via INTRAVENOUS

## 2015-11-24 MED ORDER — EPINEPHRINE HCL 1 MG/ML IJ SOLN
INTRAMUSCULAR | Status: DC | PRN
  Administered 2015-11-24: 500 mL

## 2015-11-24 MED ORDER — FENTANYL CITRATE (PF) 100 MCG/2ML IJ SOLN
25.0000 ug | Freq: Once | INTRAMUSCULAR | Status: AC
  Administered 2015-11-24: 25 ug via INTRAVENOUS

## 2015-11-24 MED ORDER — MIDAZOLAM HCL 2 MG/2ML IJ SOLN
2.0000 mg | Freq: Once | INTRAMUSCULAR | Status: AC
  Administered 2015-11-24: 2 mg via INTRAVENOUS

## 2015-11-24 MED ORDER — FENTANYL CITRATE (PF) 100 MCG/2ML IJ SOLN
INTRAMUSCULAR | Status: AC
  Filled 2015-11-24: qty 2

## 2015-11-24 MED ORDER — EPINEPHRINE HCL 1 MG/ML IJ SOLN
INTRAMUSCULAR | Status: AC
  Filled 2015-11-24: qty 1

## 2015-11-24 MED ORDER — CYCLOPENTOLATE-PHENYLEPHRINE 0.2-1 % OP SOLN
1.0000 [drp] | OPHTHALMIC | Status: AC
  Administered 2015-11-24 (×3): 1 [drp] via OPHTHALMIC

## 2015-11-24 MED ORDER — LIDOCAINE 3.5 % OP GEL OPTIME - NO CHARGE
OPHTHALMIC | Status: DC | PRN
  Administered 2015-11-24: 1 [drp] via OPHTHALMIC

## 2015-11-24 MED ORDER — POVIDONE-IODINE 5 % OP SOLN
OPHTHALMIC | Status: DC | PRN
  Administered 2015-11-24: 1 via OPHTHALMIC

## 2015-11-24 MED ORDER — LIDOCAINE HCL 3.5 % OP GEL
1.0000 "application " | Freq: Once | OPHTHALMIC | Status: AC
  Administered 2015-11-24: 1 via OPHTHALMIC

## 2015-11-24 MED ORDER — MIDAZOLAM HCL 2 MG/2ML IJ SOLN
INTRAMUSCULAR | Status: AC
  Filled 2015-11-24: qty 2

## 2015-11-24 MED ORDER — NEOMYCIN-POLYMYXIN-DEXAMETH 3.5-10000-0.1 OP SUSP
OPHTHALMIC | Status: DC | PRN
  Administered 2015-11-24: 2 [drp] via OPHTHALMIC

## 2015-11-24 MED ORDER — TETRACAINE HCL 0.5 % OP SOLN
1.0000 [drp] | OPHTHALMIC | Status: AC
  Administered 2015-11-24 (×3): 1 [drp] via OPHTHALMIC

## 2015-11-24 MED ORDER — LIDOCAINE HCL (PF) 1 % IJ SOLN
INTRAMUSCULAR | Status: DC | PRN
  Administered 2015-11-24: .5 mL

## 2015-11-24 SURGICAL SUPPLY — 11 items

## 2015-11-24 NOTE — Anesthesia Preprocedure Evaluation (Signed)
Anesthesia Evaluation  Patient identified by MRN, date of birth, ID band Patient awake    Reviewed: Allergy & Precautions, H&P , NPO status , Patient's Chart, lab work & pertinent test results  Airway Mallampati: II   Neck ROM: full    Dental   Pulmonary sleep apnea ,    breath sounds clear to auscultation       Cardiovascular negative cardio ROS   Rhythm:regular Rate:Normal     Neuro/Psych    GI/Hepatic   Endo/Other  diabetes, Type obesity  Renal/GU      Musculoskeletal  (+) Arthritis ,   Abdominal   Peds  Hematology   Anesthesia Other Findings   Reproductive/Obstetrics                             Anesthesia Physical Anesthesia Plan  ASA: II  Anesthesia Plan: MAC   Post-op Pain Management:    Induction: Intravenous  Airway Management Planned: Nasal Cannula  Additional Equipment:   Intra-op Plan:   Post-operative Plan:   Informed Consent: I have reviewed the patients History and Physical, chart, labs and discussed the procedure including the risks, benefits and alternatives for the proposed anesthesia with the patient or authorized representative who has indicated his/her understanding and acceptance.     Plan Discussed with: CRNA, Anesthesiologist and Surgeon  Anesthesia Plan Comments:         Anesthesia Quick Evaluation

## 2015-11-24 NOTE — Anesthesia Procedure Notes (Signed)
Procedure Name: MAC Date/Time: 11/24/2015 11:25 AM Performed by: Pernell DupreADAMS, Angela Alcott A Pre-anesthesia Checklist: Patient identified, Timeout performed, Emergency Drugs available, Suction available and Patient being monitored Oxygen Delivery Method: Nasal cannula

## 2015-11-24 NOTE — H&P (Signed)
I have reviewed the H&P, the patient was re-examined, and I have identified no interval changes in medical condition and plan of care since the history and physical of record  

## 2015-11-24 NOTE — Anesthesia Postprocedure Evaluation (Signed)
Anesthesia Post Note  Patient: Angela Mccarty  Procedure(s) Performed: Procedure(s) (LRB): CATARACT EXTRACTION PHACO AND INTRAOCULAR LENS PLACEMENT (IOC) (Left)  Patient location during evaluation: Short Stay Anesthesia Type: MAC Level of consciousness: awake and alert and oriented Pain management: pain level controlled Vital Signs Assessment: post-procedure vital signs reviewed and stable Respiratory status: spontaneous breathing Cardiovascular status: stable Postop Assessment: no signs of nausea or vomiting Anesthetic complications: no    Last Vitals:  Vitals:   11/24/15 1038  BP: 116/73  Temp: 37 C    Last Pain:  Vitals:   11/24/15 1038  TempSrc: Oral                 Josephus Harriger A

## 2015-11-24 NOTE — Transfer of Care (Signed)
Immediate Anesthesia Transfer of Care Note  Patient: Angela Mccarty  Procedure(s) Performed: Procedure(s) with comments: CATARACT EXTRACTION PHACO AND INTRAOCULAR LENS PLACEMENT (IOC) (Left) - CDE: 5.53  Patient Location: Short Stay  Anesthesia Type:MAC  Level of Consciousness: awake, alert , oriented and patient cooperative  Airway & Oxygen Therapy: Patient Spontanous Breathing  Post-op Assessment: Report given to RN and Post -op Vital signs reviewed and stable  Post vital signs: Reviewed and stable  Last Vitals:  Vitals:   11/24/15 1038  BP: 116/73  Temp: 37 C    Last Pain:  Vitals:   11/24/15 1038  TempSrc: Oral         Complications: No apparent anesthesia complications

## 2015-11-24 NOTE — Op Note (Signed)
Date of Admission: 11/24/2015  Date of Surgery: 11/24/2015   Pre-Op Dx: Cataract Left Eye  Post-Op Dx: Senile Combined Cataract Left  Eye,  Dx Code W09.811H25.812  Surgeon: Gemma PayorKerry Ambrosio Reuter, M.D.  Assistants: None  Anesthesia: Topical with MAC  Indications: Painless, progressive loss of vision with compromise of daily activities.  Surgery: Cataract Extraction with Intraocular lens Implant Left Eye  Discription: The patient had dilating drops and viscous lidocaine placed into the Left eye in the pre-op holding area. After transfer to the operating room, a time out was performed. The patient was then prepped and draped. Beginning with a 75 degree blade a paracentesis port was made at the surgeon's 2 o'clock position. The anterior chamber was then filled with 1% non-preserved lidocaine. This was followed by filling the anterior chamber with Provisc.  A 2.234mm keratome blade was used to make a clear corneal incision at the temporal limbus.  A bent cystatome needle was used to create a continuous tear capsulotomy. Hydrodissection was performed with balanced salt solution on a Fine canula. The lens nucleus was then removed using the phacoemulsification handpiece. Residual cortex was removed with the I&A handpiece. The anterior chamber and capsular bag were refilled with Provisc. A posterior chamber intraocular lens was placed into the capsular bag with it's injector. The implant was positioned with the Kuglan hook. The Provisc was then removed from the anterior chamber and capsular bag with the I&A handpiece. Stromal hydration of the main incision and paracentesis port was performed with BSS on a Fine canula. The wounds were tested for leak which was negative. The patient tolerated the procedure well. There were no operative complications. The patient was then transferred to the recovery room in stable condition.  Complications: None  Specimen: None  EBL: None  Prosthetic device: Hoya iSert 250, power 18.5 D, SN  [id number].

## 2015-11-24 NOTE — Discharge Instructions (Signed)

## 2015-11-24 NOTE — Addendum Note (Signed)
Addendum  created 11/24/15 1147 by Achille RichAdam Dally Oshel, MD   Anesthesia Attestations filed

## 2015-11-27 ENCOUNTER — Encounter (HOSPITAL_COMMUNITY): Payer: Self-pay | Admitting: Ophthalmology

## 2016-02-03 MED ORDER — OXYCODONE HCL 5 MG/5ML PO SOLN: 5.0000 mg | Freq: Once | ORAL | Status: DC | PRN

## 2016-02-03 MED ORDER — FENTANYL CITRATE (PF) 100 MCG/2ML IJ SOLN: 25.0000 ug | INTRAMUSCULAR | Status: DC | PRN

## 2016-02-03 MED ORDER — ONDANSETRON HCL 4 MG/2ML IJ SOLN: 4.0000 mg | Freq: Four times a day (QID) | INTRAMUSCULAR | Status: DC | PRN

## 2016-02-03 MED ORDER — OXYCODONE HCL 5 MG PO TABS: 5.0000 mg | ORAL_TABLET | Freq: Once | ORAL | Status: DC | PRN

## 2016-02-04 ENCOUNTER — Encounter (HOSPITAL_COMMUNITY)
Admission: RE | Admit: 2016-02-04 | Discharge: 2016-02-04 | Disposition: A | Discharge status: Home / Self Care | Payer: BLUE CROSS/BLUE SHIELD | Source: Ambulatory Visit | Attending: Ophthalmology | Admitting: Ophthalmology

## 2016-02-04 NOTE — Pre-Procedure Instructions (Signed)
Attempted to call patient x5 times and 3 different numbers to instructed her on preop instructions without success.

## 2016-02-04 NOTE — Pre-Procedure Instructions (Signed)
Left message on 3rd attempt on her cell number for Patient to call us back. Attempted x2 more times to contact her without success.

## 2016-02-09 ENCOUNTER — Encounter (HOSPITAL_COMMUNITY): Payer: Self-pay | Admitting: *Deleted

## 2016-02-09 ENCOUNTER — Encounter (HOSPITAL_COMMUNITY)
Admission: RE | Disposition: A | Discharge status: Home / Self Care | Payer: Self-pay | Source: Ambulatory Visit | Attending: Ophthalmology

## 2016-02-09 ENCOUNTER — Ambulatory Visit (HOSPITAL_COMMUNITY): Payer: BLUE CROSS/BLUE SHIELD | Admitting: Anesthesiology

## 2016-02-09 ENCOUNTER — Ambulatory Visit (HOSPITAL_COMMUNITY)
Admission: RE | Admit: 2016-02-09 | Discharge: 2016-02-09 | Disposition: A | Discharge status: Home / Self Care | Payer: BLUE CROSS/BLUE SHIELD | Source: Ambulatory Visit | Attending: Ophthalmology | Admitting: Ophthalmology

## 2016-02-09 DIAGNOSIS — M1990 Unspecified osteoarthritis, unspecified site: Secondary | ICD-10-CM | POA: Diagnosis not present

## 2016-02-09 DIAGNOSIS — G4730 Sleep apnea, unspecified: Secondary | ICD-10-CM | POA: Diagnosis not present

## 2016-02-09 DIAGNOSIS — E1136 Type 2 diabetes mellitus with diabetic cataract: Secondary | ICD-10-CM | POA: Diagnosis present

## 2016-02-09 HISTORY — PX: CATARACT EXTRACTION W/PHACO: SHX586

## 2016-02-09 SURGERY — PHACOEMULSIFICATION, CATARACT, WITH IOL INSERTION
Anesthesia: Monitor Anesthesia Care | Site: Eye | Laterality: Right

## 2016-02-09 MED ORDER — BSS IO SOLN
INTRAOCULAR | Status: DC | PRN
  Administered 2016-02-09: 15 mL

## 2016-02-09 MED ORDER — FENTANYL CITRATE (PF) 100 MCG/2ML IJ SOLN
INTRAMUSCULAR | Status: AC
  Filled 2016-02-09: qty 2

## 2016-02-09 MED ORDER — TETRACAINE HCL 0.5 % OP SOLN
1.0000 [drp] | OPHTHALMIC | Status: AC
  Administered 2016-02-09 (×3): 1 [drp] via OPHTHALMIC

## 2016-02-09 MED ORDER — LACTATED RINGERS IV SOLN
INTRAVENOUS | Status: DC
  Administered 2016-02-09: 10:00:00 via INTRAVENOUS

## 2016-02-09 MED ORDER — LIDOCAINE HCL 3.5 % OP GEL: 1.0000 "application " | Freq: Once | OPHTHALMIC | Status: DC

## 2016-02-09 MED ORDER — POVIDONE-IODINE 5 % OP SOLN
OPHTHALMIC | Status: DC | PRN
  Administered 2016-02-09: 1 via OPHTHALMIC

## 2016-02-09 MED ORDER — PROVISC 10 MG/ML IO SOLN
INTRAOCULAR | Status: DC | PRN
Start: 2016-02-09 — End: 2016-02-09
  Administered 2016-02-09: 0.85 mL via INTRAOCULAR

## 2016-02-09 MED ORDER — MIDAZOLAM HCL 2 MG/2ML IJ SOLN
INTRAMUSCULAR | Status: AC
Start: 2016-02-09 — End: 2016-02-09
  Filled 2016-02-09: qty 2

## 2016-02-09 MED ORDER — PHENYLEPHRINE HCL 2.5 % OP SOLN
1.0000 [drp] | OPHTHALMIC | Status: AC
  Administered 2016-02-09 (×3): 1 [drp] via OPHTHALMIC

## 2016-02-09 MED ORDER — LIDOCAINE 3.5 % OP GEL OPTIME - NO CHARGE
OPHTHALMIC | Status: DC | PRN
Start: 2016-02-09 — End: 2016-02-09
  Administered 2016-02-09: 1 [drp] via OPHTHALMIC

## 2016-02-09 MED ORDER — EPINEPHRINE PF 1 MG/ML IJ SOLN
INTRAMUSCULAR | Status: AC
  Filled 2016-02-09: qty 1

## 2016-02-09 MED ORDER — FENTANYL CITRATE (PF) 100 MCG/2ML IJ SOLN
25.0000 ug | INTRAMUSCULAR | Status: DC | PRN
  Administered 2016-02-09: 25 ug via INTRAVENOUS

## 2016-02-09 MED ORDER — EPINEPHRINE PF 1 MG/ML IJ SOLN
INTRAOCULAR | Status: DC | PRN
  Administered 2016-02-09: 500 mL

## 2016-02-09 MED ORDER — CYCLOPENTOLATE-PHENYLEPHRINE 0.2-1 % OP SOLN
1.0000 [drp] | OPHTHALMIC | Status: AC
  Administered 2016-02-09 (×3): 1 [drp] via OPHTHALMIC

## 2016-02-09 MED ORDER — MIDAZOLAM HCL 2 MG/2ML IJ SOLN
1.0000 mg | INTRAMUSCULAR | Status: DC | PRN
  Administered 2016-02-09: 2 mg via INTRAVENOUS

## 2016-02-09 MED ORDER — NEOMYCIN-POLYMYXIN-DEXAMETH 3.5-10000-0.1 OP SUSP
OPHTHALMIC | Status: DC | PRN
  Administered 2016-02-09: 1 [drp] via OPHTHALMIC

## 2016-02-09 MED ORDER — EPINEPHRINE PF 1 MG/ML IJ SOLN
INTRAOCULAR | Status: DC | PRN
  Administered 2016-02-09: .9 mL via OPHTHALMIC

## 2016-02-09 SURGICAL SUPPLY — 11 items

## 2016-02-09 NOTE — Discharge Instructions (Signed)
General Anesthesia, Adult General anesthesia is the use of medicines to make a person "go to sleep" (be unconscious) for a medical procedure. General anesthesia is often recommended when a procedure:  Is long.  Requires you to be still or in an unusual position.  Is major and can cause you to lose blood.  Is impossible to do without general anesthesia. The medicines used for general anesthesia are called general anesthetics. In addition to making you sleep, the medicines:  Prevent pain.  Control your blood pressure.  Relax your muscles. Tell a health care provider about:  Any allergies you have.  All medicines you are taking, including vitamins, herbs, eye drops, creams, and over-the-counter medicines.  Any problems you or family members have had with anesthetic medicines.  Types of anesthetics you have had in the past.  Any bleeding disorders you have.  Any surgeries you have had.  Any medical conditions you have.  Any history of heart or lung conditions, such as heart failure, sleep apnea, or chronic obstructive pulmonary disease (COPD).  Whether you are pregnant or may be pregnant.  Whether you use tobacco, alcohol, marijuana, or street drugs.  Any history of Financial plannermilitary service.  Any history of depression or anxiety. What are the risks? Generally, this is a safe procedure. However, problems may occur, including:  Allergic reaction to anesthetics.  Lung and heart problems.  Inhaling food or liquids from your stomach into your lungs (aspiration).  Injury to nerves.  Waking up during your procedure and being unable to move (rare).  Extreme agitation or a state of mental confusion (delirium) when you wake up from the anesthetic.  Air in the bloodstream, which can lead to stroke. These problems are more likely to develop if you are having a major surgery or if you have an advanced medical condition. You can prevent some of these complications by answering all of  your health care provider's questions thoroughly and by following all pre-procedure instructions. General anesthesia can cause side effects, including:  Nausea or vomiting  A sore throat from the breathing tube.  Feeling cold or shivery.  Feeling tired, washed out, or achy.  Sleepiness or drowsiness.  Confusion or agitation. What happens before the procedure? Staying hydrated  Follow instructions from your health care provider about hydration, which may include:  Up to 2 hours before the procedure - you may continue to drink clear liquids, such as water, clear fruit juice, black coffee, and plain tea. Eating and drinking restrictions  Follow instructions from your health care provider about eating and drinking, which may include:  8 hours before the procedure - stop eating heavy meals or foods such as meat, fried foods, or fatty foods.  6 hours before the procedure - stop eating light meals or foods, such as toast or cereal.  6 hours before the procedure - stop drinking milk or drinks that contain milk.  2 hours before the procedure - stop drinking clear liquids. Medicines  Ask your health care provider about:  Changing or stopping your regular medicines. This is especially important if you are taking diabetes medicines or blood thinners.  Taking medicines such as aspirin and ibuprofen. These medicines can thin your blood. Do not take these medicines before your procedure if your health care provider instructs you not to.  Taking new dietary supplements or medicines. Do not take these during the week before your procedure unless your health care provider approves them.  If you are told to take a medicine or  to continue taking a medicine on the day of the procedure, take the medicine with sips of water. °General instructions  °· Ask if you will be going home the same day, the following day, or after a longer hospital stay. °¨ Plan to have someone take you home. °¨ Plan to have  someone stay with you for the first 24 hours after you leave the hospital or clinic. °· For 3-6 weeks before the procedure, try not to use any tobacco products, such as cigarettes, chewing tobacco, and e-cigarettes. °· You may brush your teeth on the morning of the procedure, but make sure to spit out the toothpaste. °What happens during the procedure? °· You will be given anesthetics through a mask and through an IV tube in one of your veins. °· You may receive medicine to help you relax (sedative). °· As soon as you are asleep, a breathing tube may be used to help you breathe. °· An anesthesia specialist will stay with you throughout the procedure. He or she will help keep you comfortable and safe by continuing to give you medicines and adjusting the amount of medicine that you get. He or she will also watch your blood pressure, pulse, and oxygen levels to make sure that the anesthetics do not cause any problems. °· If a breathing tube was used to help you breathe, it will be removed before you wake up. °The procedure may vary among health care providers and hospitals. °What happens after the procedure? °· You will wake up, often slowly, after the procedure is complete, usually in a recovery area. °· Your blood pressure, heart rate, breathing rate, and blood oxygen level will be monitored until the medicines you were given have worn off. °· You may be given medicine to help you calm down if you feel anxious or agitated. °· If you will be going home the same day, your health care provider may check to make sure you can stand, drink, and urinate. °· Your health care providers will treat your pain and side effects before you go home. °· Do not drive for 24 hours if you received a sedative. °· You may: °¨ Feel nauseous and vomit. °¨ Have a sore throat. °¨ Have mental slowness. °¨ Feel cold or shivery. °¨ Feel sleepy. °¨ Feel tired. °¨ Feel sore or achy, even in parts of your body where you did not have surgery. °This  information is not intended to replace advice given to you by your health care provider. Make sure you discuss any questions you have with your health care provider. °Document Released: 06/08/2007 Document Revised: 08/12/2015 Document Reviewed: 02/13/2015 °Elsevier Interactive Patient Education © 2017 [employer] ° °

## 2016-02-09 NOTE — Anesthesia Postprocedure Evaluation (Signed)
Anesthesia Post Note  Patient: Angela Mccarty  Procedure(s) Performed: Procedure(s) (LRB): CATARACT EXTRACTION PHACO AND INTRAOCULAR LENS PLACEMENT (IOC) (Right)  Patient location during evaluation: Short Stay Level of consciousness: awake and alert, oriented and patient cooperative Pain management: pain level controlled Vital Signs Assessment: post-procedure vital signs reviewed and stable Respiratory status: spontaneous breathing, nonlabored ventilation and respiratory function stable Cardiovascular status: blood pressure returned to baseline Postop Assessment: no signs of nausea or vomiting Anesthetic complications: no    Last Vitals:  Vitals:   02/09/16 1000 02/09/16 1006  BP: (!) 100/59   Pulse:    Resp: 18 (!) 28  Temp:      Last Pain:  Vitals:   02/09/16 0936  TempSrc: Oral                 Mate Alegria J

## 2016-02-09 NOTE — H&P (Signed)
I have reviewed the H&P, the patient was re-examined, and I have identified no interval changes in medical condition and plan of care since the history and physical of record  

## 2016-02-09 NOTE — Anesthesia Preprocedure Evaluation (Signed)
Anesthesia Evaluation  Patient identified by MRN, date of birth, ID band Patient awake    Reviewed: Allergy & Precautions, H&P , NPO status , Patient's Chart, lab work & pertinent test results  Airway Mallampati: II   Neck ROM: full    Dental  (+) Teeth Intact   Pulmonary sleep apnea ,    breath sounds clear to auscultation       Cardiovascular negative cardio ROS   Rhythm:regular Rate:Normal     Neuro/Psych    GI/Hepatic   Endo/Other  diabetes, Type obesity  Renal/GU      Musculoskeletal  (+) Arthritis ,   Abdominal   Peds  Hematology   Anesthesia Other Findings   Reproductive/Obstetrics                             Anesthesia Physical Anesthesia Plan  ASA: II  Anesthesia Plan: MAC   Post-op Pain Management:    Induction: Intravenous  Airway Management Planned: Nasal Cannula  Additional Equipment:   Intra-op Plan:   Post-operative Plan:   Informed Consent: I have reviewed the patients History and Physical, chart, labs and discussed the procedure including the risks, benefits and alternatives for the proposed anesthesia with the patient or authorized representative who has indicated his/her understanding and acceptance.     Plan Discussed with: CRNA, Anesthesiologist and Surgeon  Anesthesia Plan Comments:         Anesthesia Quick Evaluation

## 2016-02-09 NOTE — Op Note (Signed)
Date of Admission: 02/09/2016  Date of Surgery: 02/09/2016   Pre-Op Dx: Cataract Right Eye  Post-Op Dx: Senile Combined Cataract Right  Eye,  Dx Code N82.956H25.811  Surgeon: Gemma PayorKerry Isrrael Fluckiger, M.D.  Assistants: None  Anesthesia: Topical with MAC  Indications: Painless, progressive loss of vision with compromise of daily activities.  Surgery: Cataract Extraction with Intraocular lens Implant Right Eye  Discription: The patient had dilating drops and viscous lidocaine placed into the Right eye in the pre-op holding area. After transfer to the operating room, a time out was performed. The patient was then prepped and draped. Beginning with a 75 degree blade a paracentesis port was made at the surgeon's 2 o'clock position. The anterior chamber was then filled with 1% non-preserved lidocaine. This was followed by filling the anterior chamber with Provisc.  A 2.874mm keratome blade was used to make a clear corneal incision at the temporal limbus.  A bent cystatome needle was used to create a continuous tear capsulotomy. Hydrodissection was performed with balanced salt solution on a Fine canula. The lens nucleus was then removed using the phacoemulsification handpiece. Residual cortex was removed with the I&A handpiece. The anterior chamber and capsular bag were refilled with Provisc. A posterior chamber intraocular lens was placed into the capsular bag with it's injector. The implant was positioned with the Kuglan hook. The Provisc was then removed from the anterior chamber and capsular bag with the I&A handpiece. Stromal hydration of the main incision and paracentesis port was performed with BSS on a Fine canula. The wounds were tested for leak which was negative. The patient tolerated the procedure well. There were no operative complications. The patient was then transferred to the recovery room in stable condition.  Complications: None  Specimen: None  EBL: None  Prosthetic device: Hoya iSert 250, power 19.0  D, SN [id number].

## 2016-02-09 NOTE — Transfer of Care (Signed)
Immediate Anesthesia Transfer of Care Note  Patient: Angela Mccarty  Procedure(s) Performed: Procedure(s) with comments: CATARACT EXTRACTION PHACO AND INTRAOCULAR LENS PLACEMENT (IOC) (Right) - CDE: 4.63  Patient Location: Short Stay  Anesthesia Type:MAC  Level of Consciousness: awake, alert , oriented and patient cooperative  Airway & Oxygen Therapy: Patient Spontanous Breathing  Post-op Assessment: Report given to RN, Post -op Vital signs reviewed and stable and Patient moving all extremities  Post vital signs: Reviewed and stable  Last Vitals:  Vitals:   02/09/16 1000 02/09/16 1006  BP: (!) 100/59   Pulse:    Resp: 18 (!) 28  Temp:      Last Pain:  Vitals:   02/09/16 0936  TempSrc: Oral      Patients Stated Pain Goal: 8 (02/09/16 0936)  Complications: No apparent anesthesia complications

## 2016-02-12 ENCOUNTER — Encounter (HOSPITAL_COMMUNITY): Payer: Self-pay | Admitting: Ophthalmology

## 2017-01-21 DIAGNOSIS — M722 Plantar fascial fibromatosis: Secondary | ICD-10-CM | POA: Diagnosis not present

## 2017-01-21 DIAGNOSIS — Z1389 Encounter for screening for other disorder: Secondary | ICD-10-CM | POA: Diagnosis not present

## 2017-01-21 DIAGNOSIS — Z6841 Body Mass Index (BMI) 40.0 and over, adult: Secondary | ICD-10-CM | POA: Diagnosis not present

## 2017-03-21 DIAGNOSIS — L089 Local infection of the skin and subcutaneous tissue, unspecified: Secondary | ICD-10-CM | POA: Diagnosis not present

## 2017-03-21 DIAGNOSIS — L0291 Cutaneous abscess, unspecified: Secondary | ICD-10-CM | POA: Diagnosis not present

## 2017-03-21 DIAGNOSIS — Z1389 Encounter for screening for other disorder: Secondary | ICD-10-CM | POA: Diagnosis not present

## 2017-03-21 DIAGNOSIS — Z6841 Body Mass Index (BMI) 40.0 and over, adult: Secondary | ICD-10-CM | POA: Diagnosis not present

## 2017-03-21 DIAGNOSIS — R05 Cough: Secondary | ICD-10-CM | POA: Diagnosis not present

## 2017-08-06 DIAGNOSIS — J0100 Acute maxillary sinusitis, unspecified: Secondary | ICD-10-CM | POA: Diagnosis not present

## 2017-09-21 DIAGNOSIS — Z0000 Encounter for general adult medical examination without abnormal findings: Secondary | ICD-10-CM | POA: Diagnosis not present

## 2017-09-21 DIAGNOSIS — M179 Osteoarthritis of knee, unspecified: Secondary | ICD-10-CM | POA: Diagnosis not present

## 2017-09-21 DIAGNOSIS — E782 Mixed hyperlipidemia: Secondary | ICD-10-CM | POA: Diagnosis not present

## 2017-09-21 DIAGNOSIS — I10 Essential (primary) hypertension: Secondary | ICD-10-CM | POA: Diagnosis not present

## 2017-09-21 DIAGNOSIS — Z6841 Body Mass Index (BMI) 40.0 and over, adult: Secondary | ICD-10-CM | POA: Diagnosis not present

## 2017-09-21 DIAGNOSIS — E063 Autoimmune thyroiditis: Secondary | ICD-10-CM | POA: Diagnosis not present

## 2017-09-21 DIAGNOSIS — E119 Type 2 diabetes mellitus without complications: Secondary | ICD-10-CM | POA: Diagnosis not present

## 2017-09-21 DIAGNOSIS — H6122 Impacted cerumen, left ear: Secondary | ICD-10-CM | POA: Diagnosis not present

## 2017-09-21 DIAGNOSIS — Z1389 Encounter for screening for other disorder: Secondary | ICD-10-CM | POA: Diagnosis not present

## 2017-10-04 DIAGNOSIS — Z1231 Encounter for screening mammogram for malignant neoplasm of breast: Secondary | ICD-10-CM | POA: Diagnosis not present

## 2018-03-30 DIAGNOSIS — G4733 Obstructive sleep apnea (adult) (pediatric): Secondary | ICD-10-CM | POA: Diagnosis not present

## 2018-09-18 ENCOUNTER — Other Ambulatory Visit: Payer: BLUE CROSS/BLUE SHIELD

## 2018-09-18 ENCOUNTER — Other Ambulatory Visit: Payer: Self-pay | Admitting: Internal Medicine

## 2018-09-18 DIAGNOSIS — Z20822 Contact with and (suspected) exposure to covid-19: Secondary | ICD-10-CM

## 2018-09-20 DIAGNOSIS — I10 Essential (primary) hypertension: Secondary | ICD-10-CM | POA: Diagnosis not present

## 2018-09-20 DIAGNOSIS — Z6841 Body Mass Index (BMI) 40.0 and over, adult: Secondary | ICD-10-CM | POA: Diagnosis not present

## 2018-09-20 DIAGNOSIS — Z1389 Encounter for screening for other disorder: Secondary | ICD-10-CM | POA: Diagnosis not present

## 2018-09-20 DIAGNOSIS — E039 Hypothyroidism, unspecified: Secondary | ICD-10-CM | POA: Diagnosis not present

## 2018-09-20 DIAGNOSIS — R7989 Other specified abnormal findings of blood chemistry: Secondary | ICD-10-CM | POA: Diagnosis not present

## 2018-09-20 DIAGNOSIS — E119 Type 2 diabetes mellitus without complications: Secondary | ICD-10-CM | POA: Diagnosis not present

## 2018-09-20 DIAGNOSIS — M179 Osteoarthritis of knee, unspecified: Secondary | ICD-10-CM | POA: Diagnosis not present

## 2018-09-20 DIAGNOSIS — F419 Anxiety disorder, unspecified: Secondary | ICD-10-CM | POA: Diagnosis not present

## 2018-09-23 LAB — NOVEL CORONAVIRUS, NAA: SARS-CoV-2, NAA: NOT DETECTED

## 2018-09-29 ENCOUNTER — Encounter: Payer: Self-pay | Admitting: Nurse Practitioner

## 2018-10-04 DIAGNOSIS — G4733 Obstructive sleep apnea (adult) (pediatric): Secondary | ICD-10-CM | POA: Diagnosis not present

## 2018-11-06 ENCOUNTER — Ambulatory Visit: Payer: Self-pay | Admitting: Nurse Practitioner

## 2018-11-14 DIAGNOSIS — Z1231 Encounter for screening mammogram for malignant neoplasm of breast: Secondary | ICD-10-CM | POA: Diagnosis not present

## 2019-01-26 DIAGNOSIS — E119 Type 2 diabetes mellitus without complications: Secondary | ICD-10-CM | POA: Diagnosis not present

## 2019-01-26 DIAGNOSIS — M179 Osteoarthritis of knee, unspecified: Secondary | ICD-10-CM | POA: Diagnosis not present

## 2019-01-26 DIAGNOSIS — Z6841 Body Mass Index (BMI) 40.0 and over, adult: Secondary | ICD-10-CM | POA: Diagnosis not present

## 2019-01-26 DIAGNOSIS — Z23 Encounter for immunization: Secondary | ICD-10-CM | POA: Diagnosis not present

## 2019-01-26 DIAGNOSIS — M25561 Pain in right knee: Secondary | ICD-10-CM | POA: Diagnosis not present

## 2019-02-21 DIAGNOSIS — Z6841 Body Mass Index (BMI) 40.0 and over, adult: Secondary | ICD-10-CM | POA: Diagnosis not present

## 2019-02-21 DIAGNOSIS — R1310 Dysphagia, unspecified: Secondary | ICD-10-CM | POA: Diagnosis not present

## 2019-02-21 DIAGNOSIS — K219 Gastro-esophageal reflux disease without esophagitis: Secondary | ICD-10-CM | POA: Diagnosis not present

## 2019-02-28 ENCOUNTER — Encounter: Payer: Self-pay | Admitting: Internal Medicine

## 2019-03-12 ENCOUNTER — Other Ambulatory Visit: Payer: Self-pay

## 2019-03-12 ENCOUNTER — Ambulatory Visit: Payer: BC Managed Care – PPO | Attending: Internal Medicine

## 2019-03-12 DIAGNOSIS — Z20822 Contact with and (suspected) exposure to covid-19: Secondary | ICD-10-CM

## 2019-03-12 DIAGNOSIS — Z20828 Contact with and (suspected) exposure to other viral communicable diseases: Secondary | ICD-10-CM | POA: Diagnosis not present

## 2019-03-14 LAB — NOVEL CORONAVIRUS, NAA: SARS-CoV-2, NAA: NOT DETECTED

## 2019-03-21 ENCOUNTER — Other Ambulatory Visit: Payer: Self-pay

## 2019-03-21 ENCOUNTER — Encounter: Payer: Self-pay | Admitting: Nurse Practitioner

## 2019-03-21 ENCOUNTER — Telehealth: Payer: Self-pay

## 2019-03-21 ENCOUNTER — Ambulatory Visit (INDEPENDENT_AMBULATORY_CARE_PROVIDER_SITE_OTHER): Payer: BC Managed Care – PPO | Admitting: Nurse Practitioner

## 2019-03-21 DIAGNOSIS — R1310 Dysphagia, unspecified: Secondary | ICD-10-CM | POA: Insufficient documentation

## 2019-03-21 DIAGNOSIS — Z0000 Encounter for general adult medical examination without abnormal findings: Secondary | ICD-10-CM | POA: Diagnosis not present

## 2019-03-21 DIAGNOSIS — R1319 Other dysphagia: Secondary | ICD-10-CM

## 2019-03-21 DIAGNOSIS — Z1211 Encounter for screening for malignant neoplasm of colon: Secondary | ICD-10-CM

## 2019-03-21 MED ORDER — PEG 3350-KCL-NA BICARB-NACL 420 G PO SOLR: 4000.0000 mL | ORAL | 0 refills | Status: DC

## 2019-03-21 NOTE — Progress Notes (Signed)
Referring Provider: Sharilyn Sites, MD Primary Care Physician:  Sharilyn Sites, MD Primary GI:  Dr. Gala Romney  NOTE: Service was provided via telemedicine and was requested by the patient due to COVID-19 pandemic.  Method of visit: Doxy.Me   Patient Location: In parked car at work  Provider Location: Office  Reason for Phone Visit: Referral for dysphagia, reflux, and TCS  The patient was consented to phone follow-up via telephone encounter including billing of the encounter (yes/no): Yes  Persons present on the phone encounter, with roles: None  Total time (minutes) spent on medical discussion: 22 minutes  Chief Complaint  Patient presents with  . Dysphagia    feels like food don't go down  . Consult    TCS    HPI:   Angela Mccarty is a 59 y.o. female who presents for virtual visit regarding: Referral from primary care for dysphagia, GERD, schedule colonoscopy.  Reviewed information provided with referral including primary care office visit note dated 02/21/2019.  At that time she complained of heartburn for the past 1 to 2 months described as burning in the epigastric and substernal area and occasional sour taste in the posterior pharynx, has not tried any medications or treatments.  Exam was essentially normal.  Recommended PPI, Gas-X as needed, refer to GI for further evaluation.  No history of colonoscopy found in our system.  Today she states she's going ok. She has never had a colonoscopy before. Having GERD and solid food dysphagia and occasionally fluids as well (less often). Solid food dysphagia occurs about once a day. No GERD symptoms. Mostly bloating. Has a bowel movement about every day, consistent with [place] 4. When she bends forward, sometimes has a knott sensation near the top of her stomach. Denies abdominal pain, N/V, hematochezia, melena, fever, chills, unintentional weight loss. Denies URI or flu-like symptoms. Denies loss of sense of taste or smell. Denies  chest pain, dyspnea, dizziness, lightheadedness, syncope, near syncope. Denies any other upper or lower GI symptoms.  Recently COVID tested about a week ago and was negative. She still uses a CPAP.  Past Medical History:  Diagnosis Date  . Arthritis   . Diabetes mellitus   . Obesity   . Sleep apnea with use of continuous positive airway pressure (CPAP)   . Thyroid disease     Past Surgical History:  Procedure Laterality Date  . CATARACT EXTRACTION W/PHACO Left 11/24/2015   Procedure: CATARACT EXTRACTION PHACO AND INTRAOCULAR LENS PLACEMENT (IOC);  Surgeon: Tonny Branch, MD;  Location: [facility];  Service: Ophthalmology;  Laterality: Left;  CDE: 5.53  . CATARACT EXTRACTION W/PHACO Right 02/09/2016   Procedure: CATARACT EXTRACTION PHACO AND INTRAOCULAR LENS PLACEMENT (IOC);  Surgeon: Tonny Branch, MD;  Location: [facility];  Service: Ophthalmology;  Laterality: Right;  CDE: 4.63    Current Outpatient Medications  Medication Sig Dispense Refill  . cholecalciferol (VITAMIN D) 400 units TABS tablet Take 800 Units by mouth daily.    . hydrochlorothiazide (HYDRODIURIL) 25 MG tablet TAKE ONE TABLET BY MOUTH ONCE DAILY 90 tablet 1  . levothyroxine (SYNTHROID, LEVOTHROID) 125 MCG tablet Take 125 mcg by mouth daily.  0  . meloxicam (MOBIC) 15 MG tablet Take 15 mg by mouth daily.    . Omega-3 Fatty Acids (OMEGA 3 PO) Take 2 capsules by mouth daily.     . pantoprazole (PROTONIX) 40 MG tablet Take 40 mg by mouth daily.    Marland Kitchen ALPRAZolam (XANAX) 0.5 MG tablet Take 0.5 mg by  mouth at bedtime as needed for anxiety or sleep.    . APPLE CIDER VINEGAR PO Take 1 capsule by mouth 2 (two) times daily.    Marland Kitchen aspirin EC 81 MG tablet Take 81 mg by mouth daily.    Marland Kitchen CINNAMON PO Take 1 tablet by mouth daily.    Marland Kitchen ibuprofen (ADVIL,MOTRIN) 800 MG tablet Take 800 mg by mouth daily as needed for mild pain.   0  . TURMERIC PO Take 1 tablet by mouth daily.     No current facility-administered medications for this visit.     Allergies as of 03/21/2019 - Review Complete 03/21/2019  Allergen Reaction Noted  . Nabumetone Hives, Shortness Of Breath, and Itching 01/26/2011    Family History  Problem Relation Age of Onset  . Cancer Mother        ovarian, lung, liver  . Diabetes Mother   . Hypertension Father   . Glaucoma Father   . Hypertension Sister   . Hypertension Brother   . Cancer Paternal Aunt        breast  . Cancer Paternal Uncle   . Diabetes Maternal Grandmother   . Thyroid disease Sister   . Hypertension Sister   . Hypertension Brother     Social History   Socioeconomic History  . Marital status: Widowed    Spouse name: Not on file  . Number of children: Not on file  . Years of education: Not on file  . Highest education level: Not on file  Occupational History  . Not on file  Tobacco Use  . Smoking status: Never Smoker  . Smokeless tobacco: Never Used  Substance and Sexual Activity  . Alcohol use: No  . Drug use: No  . Sexual activity: Not Currently    Birth control/protection: Post-menopausal  Other Topics Concern  . Not on file  Social History Narrative  . Not on file   Social Determinants of Health   Financial Resource Strain:   . Difficulty of Paying Living Expenses: Not on file  Food Insecurity:   . Worried About Programme researcher, broadcasting/film/video in the Last Year: Not on file  . Ran Out of Food in the Last Year: Not on file  Transportation Needs:   . Lack of Transportation (Medical): Not on file  . Lack of Transportation (Non-Medical): Not on file  Physical Activity:   . Days of Exercise per Week: Not on file  . Minutes of Exercise per Session: Not on file  Stress:   . Feeling of Stress : Not on file  Social Connections:   . Frequency of Communication with Friends and Family: Not on file  . Frequency of Social Gatherings with Friends and Family: Not on file  . Attends Religious Services: Not on file  . Active Member of Clubs or Organizations: Not on file  . Attends  Banker Meetings: Not on file  . Marital Status: Not on file    Review of Systems: General: Negative for anorexia, weight loss, fever, chills, fatigue, weakness. ENT: Negative for hoarseness, difficulty swallowing. CV: Negative for chest pain, angina, palpitations, peripheral edema.  Respiratory: Negative for dyspnea at rest, cough, sputum, wheezing.  GI: See history of present illness. MS: Negative for joint pain, low back pain.  Derm: Negative for rash or itching.  Endo: Negative for unusual weight change.  Heme: Negative for bruising or bleeding. Allergy: Negative for rash or hives.  Physical Exam: Note: limited exam due to virtual visit General:  Alert and oriented. Pleasant and cooperative. Well-nourished and well-developed.  Head:  Normocephalic and atraumatic. Eyes:  Without icterus, sclera clear and conjunctiva pink.  Ears:  Normal auditory acuity. Skin:  Intact without facial significant lesions or rashes. Neurologic:  Alert and oriented x4;  grossly normal neurologically. Psych:  Alert and cooperative. Normal mood and affect. Heme/Lymph/Immune: No excessive bruising noted.

## 2019-03-21 NOTE — Assessment & Plan Note (Signed)
The patient is 59 years old and has never had a colonoscopy before.  At this point we will proceed with scheduling of her colonoscopy.  No lower GI symptoms.  Continue current medications and follow-up in 4 months.  Proceed with TCS on propofol/MAC with Dr. Gala Romney in near future: the risks, benefits, and alternatives have been discussed with the patient in detail. The patient states understanding and desires to proceed.  The patient is currently on Xanax.  No other anticoagulants, anxiolytics, chronic pain medications, antidepressants, anxiolytics, antidiabetics, or iron supplements.  Given her Xanax use and her BMI (calculated at approximately 51) we will plan for her procedure on propofol/MAC to promote adequate sedation.  Of note she does use CPAP and I have asked her to write down her settings and bring them with her the day of her procedure.

## 2019-03-21 NOTE — Patient Instructions (Signed)
Your health issues we discussed today were:   Dysphagia (swallowing difficulties): 1. We will schedule an upper endoscopy for you with possible dilation to help evaluate and treat your symptoms 2. Further recommendations will follow 3. Education material below related to soft foods.  Make sure you chew adequately and take small bites, keep fluids on hand while eating to help prevent swallowing difficulties  Need for colonoscopy: 1. Because you need a colonoscopy, we will schedule this for the same day as your upper endoscopy. 2. Further recommendations will follow  Overall I recommend:  1. Continue your other current medications 2. Return for follow-up in 4 months 3. Call us if you have any questions or concerns.   Because of recent events of COVID-19 ("Coronavirus"), follow CDC recommendations:  Wash your hand frequently Avoid touching your face Stay away from people who are sick If you have symptoms such as fever, cough, shortness of breath then call your healthcare provider for further guidance If you are sick, STAY AT HOME unless otherwise directed by your healthcare provider. Follow directions from state and national officials regarding staying safe   At [facility] we value your feedback. You may receive a survey about your visit today. Please share your experience as we strive to create trusting relationships with our patients to provide genuine, compassionate, quality care.  We appreciate your understanding and patience as we review any laboratory studies, imaging, and other diagnostic tests that are ordered as we care for you. Our office policy is 5 business days for review of these results, and any emergent or urgent results are addressed in a timely manner for your best interest. If you do not hear from our office in 1 week, please contact us.   We also encourage the use of MyChart, which contains your medical information for your review as well. If you are  not enrolled in this feature, an access code is on this after visit summary for your convenience. Thank you for allowing [place] to be involved in your care.  It was great to see you today!  I hope you have a Happy New Year!!

## 2019-03-21 NOTE — Telephone Encounter (Signed)
Called pt, TCS/EGD/-/+DIL w/Propofol w/RMR scheduled for 06/11/19 at 8:45am. Rx for prep sent to pharmacy. Orders entered.

## 2019-03-21 NOTE — Assessment & Plan Note (Signed)
The patient describes intermittent solid food dysphagia symptoms which occur approximately once a day or every other day.  No pill dysphagia.  Sometimes it feels like fluids get stuck, but this is quite rare.  She is not having any overt GERD symptoms.  Her PCP did start her on Protonix and I recommended she continue this.  At this point we will proceed with an upper endoscopy with possible dilation.  Proceed with EGD +/- dilation on propofol/MAC with Dr. Gala Romney in near future: the risks, benefits, and alternatives have been discussed with the patient in detail. The patient states understanding and desires to proceed.  The patient is currently on Xanax.  No other anticoagulants, anxiolytics, chronic pain medications, antidepressants, anxiolytics, antidiabetics, or iron supplements.  Given her Xanax use and her BMI (calculated at approximately 51) we will plan for her procedure on propofol/MAC to promote adequate sedation.  Of note she does use CPAP and I have asked her to write down her settings and bring them with her the day of her procedure.

## 2019-03-22 ENCOUNTER — Encounter: Payer: Self-pay | Admitting: Internal Medicine

## 2019-03-22 NOTE — Telephone Encounter (Signed)
Pre-op and COVID test 06/08/19. Appt letter mailed with procedure instructions.

## 2019-03-22 NOTE — Progress Notes (Signed)
CC'ED TO PCP 

## 2019-03-26 DIAGNOSIS — M1991 Primary osteoarthritis, unspecified site: Secondary | ICD-10-CM | POA: Diagnosis not present

## 2019-03-26 DIAGNOSIS — Z6841 Body Mass Index (BMI) 40.0 and over, adult: Secondary | ICD-10-CM | POA: Diagnosis not present

## 2019-03-26 DIAGNOSIS — Z1389 Encounter for screening for other disorder: Secondary | ICD-10-CM | POA: Diagnosis not present

## 2019-03-26 DIAGNOSIS — M1711 Unilateral primary osteoarthritis, right knee: Secondary | ICD-10-CM | POA: Diagnosis not present

## 2019-04-06 DIAGNOSIS — E039 Hypothyroidism, unspecified: Secondary | ICD-10-CM | POA: Diagnosis not present

## 2019-04-06 DIAGNOSIS — E119 Type 2 diabetes mellitus without complications: Secondary | ICD-10-CM | POA: Diagnosis not present

## 2019-04-06 DIAGNOSIS — Z23 Encounter for immunization: Secondary | ICD-10-CM | POA: Diagnosis not present

## 2019-04-06 DIAGNOSIS — Z0001 Encounter for general adult medical examination with abnormal findings: Secondary | ICD-10-CM | POA: Diagnosis not present

## 2019-04-06 DIAGNOSIS — Z6841 Body Mass Index (BMI) 40.0 and over, adult: Secondary | ICD-10-CM | POA: Diagnosis not present

## 2019-04-06 DIAGNOSIS — M179 Osteoarthritis of knee, unspecified: Secondary | ICD-10-CM | POA: Diagnosis not present

## 2019-06-04 ENCOUNTER — Other Ambulatory Visit: Payer: Self-pay

## 2019-06-04 MED ORDER — CLENPIQ 10-3.5-12 MG-GM -GM/160ML PO SOLN: 1.0000 | Freq: Once | ORAL | 0 refills | Status: AC

## 2019-06-04 NOTE — Telephone Encounter (Signed)
Patient called and stated the pharmacy is out of Trylyte.  Can we send a different prep

## 2019-06-04 NOTE — Telephone Encounter (Signed)
Rx for Clenpiq sent to pharmacy.   Clenpiq coupon and instructions given to CM to give to pt.

## 2019-06-05 NOTE — Patient Instructions (Signed)
Angela Mccarty  06/05/2019     @PREFPERIOPPHARMACY @   Your procedure is scheduled on   06/11/2019   Report to 06/13/2019 at  [facility]  A.M.  Call this number if you have problems the morning of surgery:  [phone]   Remember:  Follow the diet and prep instructions given to you by Dr [phone] office.                     Take these medicines the morning of surgery with A SIP OF WATER  Celebrex, levothyroxine, protonix, tramadol(if needed). DO NOT take any medications for diabetes the morning of your procedure.    Do not wear jewelry, make-up or nail polish.  Do not wear lotions, powders, or perfumes. Please wear deodorant and brush your teeth.  Do not shave 48 hours prior to surgery.  Men may shave face and neck.  Do not bring valuables to the hospital.  [facility] is not responsible for any belongings or valuables.  Contacts, dentures or bridgework may not be worn into surgery.  Leave your suitcase in the car.  After surgery it may be brought to your room.  For patients admitted to the hospital, discharge time will be determined by your treatment team.  Patients discharged the day of surgery will not be allowed to drive home.   Name and phone number of your driver:   family Special instructions:  DO NOT smoke the morning of your procedure.  Please read over the following fact sheets that you were given. Anesthesia Post-op Instructions and Care and Recovery After Surgery       Upper Endoscopy, Adult, Care After This sheet gives you information about how to care for yourself after your procedure. Your health care provider may also give you more specific instructions. If you have problems or questions, contact your health care provider. What can I expect after the procedure? After the procedure, it is common to have:  A sore throat.  Mild stomach pain or discomfort.  Bloating.  Nausea. Follow these instructions at home:   Follow instructions from  your health care provider about what to eat or drink after your procedure.  Return to your normal activities as told by your health care provider. Ask your health care provider what activities are safe for you.  Take over-the-counter and prescription medicines only as told by your health care provider.  Do not drive for 24 hours if you were given a sedative during your procedure.  Keep all follow-up visits as told by your health care provider. This is important. Contact a health care provider if you have:  A sore throat that lasts longer than one day.  Trouble swallowing. Get help right away if:  You vomit blood or your vomit looks like coffee grounds.  You have: ? A fever. ? Bloody, black, or tarry stools. ? A severe sore throat or you cannot swallow. ? Difficulty breathing. ? Severe pain in your chest or abdomen. Summary  After the procedure, it is common to have a sore throat, mild stomach discomfort, bloating, and nausea.  Do not drive for 24 hours if you were given a sedative during the procedure.  Follow instructions from your health care provider about what to eat or drink after your procedure.  Return to your normal activities as told by your health care provider. This information is not intended to replace advice given to you by  your health care provider. Make sure you discuss any questions you have with your health care provider. Document Revised: 08/23/2017 Document Reviewed: 08/01/2017 Elsevier Patient Education  2020 [employer]  Esophageal Dilatation Esophageal dilatation, also called esophageal dilation, is a procedure to widen or open (dilate) a blocked or narrowed part of the esophagus. The esophagus is the part of the body that moves food and liquid from the mouth to the stomach. You may need this procedure if:  You have a buildup of scar tissue in your esophagus that makes it difficult, painful, or impossible to swallow. This can be caused by  gastroesophageal reflux disease (GERD).  You have cancer of the esophagus.  There is a problem with how food moves through your esophagus. In some cases, you may need this procedure repeated at a later time to dilate the esophagus gradually. Tell a health care provider about:  Any allergies you have.  All medicines you are taking, including vitamins, herbs, eye drops, creams, and over-the-counter medicines.  Any problems you or family members have had with anesthetic medicines.  Any blood disorders you have.  Any surgeries you have had.  Any medical conditions you have.  Any antibiotic medicines you are required to take before dental procedures.  Whether you are pregnant or may be pregnant. What are the risks? Generally, this is a safe procedure. However, problems may occur, including:  Bleeding due to a tear in the lining of the esophagus.  A hole (perforation) in the esophagus. What happens before the procedure?  Follow instructions from your health care provider about eating or drinking restrictions.  Ask your health care provider about changing or stopping your regular medicines. This is especially important if you are taking diabetes medicines or blood thinners.  Plan to have someone take you home from the hospital or clinic.  Plan to have a responsible adult care for you for at least 24 hours after you leave the hospital or clinic. This is important. What happens during the procedure?  You may be given a medicine to help you relax (sedative).  A numbing medicine may be sprayed into the back of your throat, or you may gargle the medicine.  Your health care provider may perform the dilatation using various surgical instruments, such as: ? Simple dilators. This instrument is carefully placed in the esophagus to stretch it. ? Guided wire bougies. This involves using an endoscope to insert a wire into the esophagus. A dilator is passed over this wire to enlarge the  esophagus. Then the wire is removed. ? Balloon dilators. An endoscope with a small balloon at the end is inserted into the esophagus. The balloon is inflated to stretch the esophagus and open it up. The procedure may vary among health care providers and hospitals. What happens after the procedure?  Your blood pressure, heart rate, breathing rate, and blood oxygen level will be monitored until the medicines you were given have worn off.  Your throat may feel slightly sore and numb. This will improve slowly over time.  You will not be allowed to eat or drink until your throat is no longer numb.  When you are able to drink, urinate, and sit on the edge of the bed without nausea or dizziness, you may be able to return home. Follow these instructions at home:  Take over-the-counter and prescription medicines only as told by your health care provider.  Do not drive for 24 hours if you were given a sedative during your procedure.  You should have a responsible adult with you for 24 hours after the procedure.  Follow instructions from your health care provider about any eating or drinking restrictions.  Do not use any products that contain nicotine or tobacco, such as cigarettes and e-cigarettes. If you need help quitting, ask your health care provider.  Keep all follow-up visits as told by your health care provider. This is important. Get help right away if you:  Have a fever.  Have chest pain.  Have pain that is not relieved by medication.  Have trouble breathing.  Have trouble swallowing.  Vomit blood. Summary  Esophageal dilatation, also called esophageal dilation, is a procedure to widen or open (dilate) a blocked or narrowed part of the esophagus.  Plan to have someone take you home from the hospital or clinic.  For this procedure, a numbing medicine may be sprayed into the back of your throat, or you may gargle the medicine.  Do not drive for 24 hours if you were given a  sedative during your procedure. This information is not intended to replace advice given to you by your health care provider. Make sure you discuss any questions you have with your health care provider. Document Revised: 12/27/2018 Document Reviewed: 01/04/2017 Elsevier Patient Education  2020 [employer].  Colonoscopy, Adult, Care After This sheet gives you information about how to care for yourself after your procedure. Your health care provider may also give you more specific instructions. If you have problems or questions, contact your health care provider. What can I expect after the procedure? After the procedure, it is common to have:  A small amount of blood in your stool for 24 hours after the procedure.  Some gas.  Mild cramping or bloating of your abdomen. Follow these instructions at home: Eating and drinking   Drink enough fluid to keep your urine pale yellow.  Follow instructions from your health care provider about eating or drinking restrictions.  Resume your normal diet as instructed by your health care provider. Avoid heavy or fried foods that are hard to digest. Activity  Rest as told by your health care provider.  Avoid sitting for a long time without moving. Get up to take short walks every 1-2 hours. This is important to improve blood flow and breathing. Ask for help if you feel weak or unsteady.  Return to your normal activities as told by your health care provider. Ask your health care provider what activities are safe for you. Managing cramping and bloating   Try walking around when you have cramps or feel bloated.  Apply heat to your abdomen as told by your health care provider. Use the heat source that your health care provider recommends, such as a moist heat pack or a heating pad. ? Place a towel between your skin and the heat source. ? Leave the heat on for 20-30 minutes. ? Remove the heat if your skin turns bright red. This is especially  important if you are unable to feel pain, heat, or cold. You may have a greater risk of getting burned. General instructions  For the first 24 hours after the procedure: ? Do not drive or use machinery. ? Do not sign important documents. ? Do not drink alcohol. ? Do your regular daily activities at a slower pace than normal. ? Eat soft foods that are easy to digest.  Take over-the-counter and prescription medicines only as told by your health care provider.  Keep all follow-up visits as told  by your health care provider. This is important. Contact a health care provider if:  You have blood in your stool 2-3 days after the procedure. Get help right away if you have:  More than a small spotting of blood in your stool.  Large blood clots in your stool.  Swelling of your abdomen.  Nausea or vomiting.  A fever.  Increasing pain in your abdomen that is not relieved with medicine. Summary  After the procedure, it is common to have a small amount of blood in your stool. You may also have mild cramping and bloating of your abdomen.  For the first 24 hours after the procedure, do not drive or use machinery, sign important documents, or drink alcohol.  Get help right away if you have a lot of blood in your stool, nausea or vomiting, a fever, or increased pain in your abdomen. This information is not intended to replace advice given to you by your health care provider. Make sure you discuss any questions you have with your health care provider. Document Revised: 09/25/2018 Document Reviewed: 09/25/2018 Elsevier Patient Education  [place] After These instructions provide you with information about caring for yourself after your procedure. Your health care provider may also give you more specific instructions. Your treatment has been planned according to current medical practices, but problems sometimes occur. Call your health care provider if you  have any problems or questions after your procedure. What can I expect after the procedure? After your procedure, you may:  Feel sleepy for several hours.  Feel clumsy and have poor balance for several hours.  Feel forgetful about what happened after the procedure.  Have poor judgment for several hours.  Feel nauseous or vomit.  Have a sore throat if you had a breathing tube during the procedure. Follow these instructions at home: For at least 24 hours after the procedure:      Have a responsible adult stay with you. It is important to have someone help care for you until you are awake and alert.  Rest as needed.  Do not: ? Participate in activities in which you could fall or become injured. ? Drive. ? Use heavy machinery. ? Drink alcohol. ? Take sleeping pills or medicines that cause drowsiness. ? Make important decisions or sign legal documents. ? Take care of children on your own. Eating and drinking  Follow the diet that is recommended by your health care provider.  If you vomit, drink water, juice, or soup when you can drink without vomiting.  Make sure you have little or no nausea before eating solid foods. General instructions  Take over-the-counter and prescription medicines only as told by your health care provider.  If you have sleep apnea, surgery and certain medicines can increase your risk for breathing problems. Follow instructions from your health care provider about wearing your sleep device: ? Anytime you are sleeping, including during daytime naps. ? While taking prescription pain medicines, sleeping medicines, or medicines that make you drowsy.  If you smoke, do not smoke without supervision.  Keep all follow-up visits as told by your health care provider. This is important. Contact a health care provider if:  You keep feeling nauseous or you keep vomiting.  You feel light-headed.  You develop a rash.  You have a fever. Get help right  away if:  You have trouble breathing. Summary  For several hours after your procedure, you may feel sleepy and have poor judgment.  Have a responsible adult stay with you for at least 24 hours or until you are awake and alert. This information is not intended to replace advice given to you by your health care provider. Make sure you discuss any questions you have with your health care provider. Document Revised: 05/30/2017 Document Reviewed: 06/22/2015 Elsevier Patient Education  [place].

## 2019-06-08 ENCOUNTER — Encounter (HOSPITAL_COMMUNITY)
Admission: RE | Admit: 2019-06-08 | Discharge: 2019-06-08 | Disposition: A | Discharge status: Home / Self Care | Payer: BC Managed Care – PPO | Source: Ambulatory Visit | Attending: Internal Medicine | Admitting: Internal Medicine

## 2019-06-08 ENCOUNTER — Encounter (HOSPITAL_COMMUNITY): Payer: Self-pay

## 2019-06-08 ENCOUNTER — Other Ambulatory Visit (HOSPITAL_COMMUNITY)
Admission: RE | Admit: 2019-06-08 | Discharge: 2019-06-08 | Disposition: A | Discharge status: Home / Self Care | Payer: BC Managed Care – PPO | Source: Ambulatory Visit | Attending: Internal Medicine | Admitting: Internal Medicine

## 2019-06-08 ENCOUNTER — Other Ambulatory Visit: Payer: Self-pay

## 2019-06-08 DIAGNOSIS — Z01812 Encounter for preprocedural laboratory examination: Secondary | ICD-10-CM | POA: Diagnosis not present

## 2019-06-08 DIAGNOSIS — Z20822 Contact with and (suspected) exposure to covid-19: Secondary | ICD-10-CM | POA: Diagnosis not present

## 2019-06-08 HISTORY — DX: Hypothyroidism, unspecified: E03.9

## 2019-06-08 HISTORY — DX: Gastro-esophageal reflux disease without esophagitis: K21.9

## 2019-06-08 HISTORY — DX: Other disorders of electrolyte and fluid balance, not elsewhere classified: E87.8

## 2019-06-08 LAB — BASIC METABOLIC PANEL
Anion gap: 10 (ref 5–15)
BUN: 13 mg/dL (ref 6–20)
CO2: 26 mmol/L (ref 22–32)
Calcium: 9.4 mg/dL (ref 8.9–10.3)
Chloride: 104 mmol/L (ref 98–111)
Creatinine, Ser: 1 mg/dL (ref 0.44–1.00)
GFR calc Af Amer: >60 mL/min (ref >60)
GFR calc non Af Amer: >60 mL/min (ref >60)
Glucose, Bld: 101 mg/dL — ABNORMAL HIGH (ref 70–99)
Potassium: 3.9 mmol/L (ref 3.5–5.1)
Sodium: 140 mmol/L (ref 135–145)

## 2019-06-09 LAB — SARS CORONAVIRUS 2 (TAT 6-24 HRS): SARS Coronavirus 2: NEGATIVE

## 2019-06-11 ENCOUNTER — Ambulatory Visit (HOSPITAL_COMMUNITY)
Admission: RE | Admit: 2019-06-11 | Discharge: 2019-06-11 | Disposition: A | Discharge status: Home / Self Care | Payer: BC Managed Care – PPO | Attending: Internal Medicine | Admitting: Internal Medicine

## 2019-06-11 ENCOUNTER — Ambulatory Visit (HOSPITAL_COMMUNITY): Payer: BC Managed Care – PPO | Admitting: Anesthesiology

## 2019-06-11 ENCOUNTER — Encounter (HOSPITAL_COMMUNITY): Payer: Self-pay | Admitting: Internal Medicine

## 2019-06-11 ENCOUNTER — Encounter (HOSPITAL_COMMUNITY)
Admission: RE | Disposition: A | Discharge status: Home / Self Care | Payer: Self-pay | Source: Home / Self Care | Attending: Internal Medicine

## 2019-06-11 DIAGNOSIS — K642 Third degree hemorrhoids: Secondary | ICD-10-CM | POA: Insufficient documentation

## 2019-06-11 DIAGNOSIS — R1314 Dysphagia, pharyngoesophageal phase: Secondary | ICD-10-CM | POA: Insufficient documentation

## 2019-06-11 DIAGNOSIS — Z79890 Hormone replacement therapy (postmenopausal): Secondary | ICD-10-CM | POA: Insufficient documentation

## 2019-06-11 DIAGNOSIS — K219 Gastro-esophageal reflux disease without esophagitis: Secondary | ICD-10-CM | POA: Insufficient documentation

## 2019-06-11 DIAGNOSIS — M1990 Unspecified osteoarthritis, unspecified site: Secondary | ICD-10-CM | POA: Insufficient documentation

## 2019-06-11 DIAGNOSIS — Z8349 Family history of other endocrine, nutritional and metabolic diseases: Secondary | ICD-10-CM | POA: Diagnosis not present

## 2019-06-11 DIAGNOSIS — Z833 Family history of diabetes mellitus: Secondary | ICD-10-CM | POA: Insufficient documentation

## 2019-06-11 DIAGNOSIS — R1319 Other dysphagia: Secondary | ICD-10-CM

## 2019-06-11 DIAGNOSIS — Z1211 Encounter for screening for malignant neoplasm of colon: Secondary | ICD-10-CM | POA: Insufficient documentation

## 2019-06-11 DIAGNOSIS — R1310 Dysphagia, unspecified: Secondary | ICD-10-CM

## 2019-06-11 DIAGNOSIS — Z791 Long term (current) use of non-steroidal anti-inflammatories (NSAID): Secondary | ICD-10-CM | POA: Diagnosis not present

## 2019-06-11 DIAGNOSIS — E039 Hypothyroidism, unspecified: Secondary | ICD-10-CM | POA: Insufficient documentation

## 2019-06-11 DIAGNOSIS — E119 Type 2 diabetes mellitus without complications: Secondary | ICD-10-CM | POA: Insufficient documentation

## 2019-06-11 DIAGNOSIS — Z79899 Other long term (current) drug therapy: Secondary | ICD-10-CM | POA: Diagnosis not present

## 2019-06-11 DIAGNOSIS — G4730 Sleep apnea, unspecified: Secondary | ICD-10-CM | POA: Diagnosis not present

## 2019-06-11 DIAGNOSIS — K644 Residual hemorrhoidal skin tags: Secondary | ICD-10-CM | POA: Diagnosis not present

## 2019-06-11 HISTORY — PX: COLONOSCOPY WITH PROPOFOL: SHX5780

## 2019-06-11 HISTORY — PX: MALONEY DILATION: SHX5535

## 2019-06-11 HISTORY — PX: ESOPHAGOGASTRODUODENOSCOPY (EGD) WITH PROPOFOL: SHX5813

## 2019-06-11 LAB — GLUCOSE, CAPILLARY
Glucose-Capillary: 105 mg/dL — ABNORMAL HIGH (ref 70–99)
Glucose-Capillary: 101 mg/dL — ABNORMAL HIGH (ref 70–99)

## 2019-06-11 SURGERY — COLONOSCOPY WITH PROPOFOL
Anesthesia: General

## 2019-06-11 MED ORDER — LIDOCAINE HCL (CARDIAC) PF 100 MG/5ML IV SOSY
PREFILLED_SYRINGE | INTRAVENOUS | Status: DC | PRN
  Administered 2019-06-11: 30 mg via INTRATRACHEAL

## 2019-06-11 MED ORDER — KETAMINE HCL 50 MG/5ML IJ SOSY
PREFILLED_SYRINGE | INTRAMUSCULAR | Status: AC
  Filled 2019-06-11: qty 5

## 2019-06-11 MED ORDER — PROPOFOL 10 MG/ML IV BOLUS
INTRAVENOUS | Status: AC
  Filled 2019-06-11: qty 20

## 2019-06-11 MED ORDER — LACTATED RINGERS IV SOLN: INTRAVENOUS | Status: DC | PRN

## 2019-06-11 MED ORDER — CHLORHEXIDINE GLUCONATE CLOTH 2 % EX PADS: 6.0000 | MEDICATED_PAD | Freq: Once | CUTANEOUS | Status: DC

## 2019-06-11 MED ORDER — GLYCOPYRROLATE 0.2 MG/ML IJ SOLN
INTRAMUSCULAR | Status: DC | PRN
  Administered 2019-06-11: .1 mg via INTRAVENOUS

## 2019-06-11 MED ORDER — KETAMINE HCL 10 MG/ML IJ SOLN
INTRAMUSCULAR | Status: DC | PRN
  Administered 2019-06-11: 30 mg via INTRAVENOUS
  Administered 2019-06-11 (×2): 10 mg via INTRAVENOUS

## 2019-06-11 MED ORDER — PHENYLEPHRINE 40 MCG/ML (10ML) SYRINGE FOR IV PUSH (FOR BLOOD PRESSURE SUPPORT)
PREFILLED_SYRINGE | INTRAVENOUS | Status: AC
  Filled 2019-06-11: qty 10

## 2019-06-11 MED ORDER — LACTATED RINGERS IV SOLN
Freq: Once | INTRAVENOUS | Status: AC
  Administered 2019-06-11: 1000 mL via INTRAVENOUS

## 2019-06-11 MED ORDER — PROPOFOL 500 MG/50ML IV EMUL
INTRAVENOUS | Status: DC | PRN
  Administered 2019-06-11: 150 ug/kg/min via INTRAVENOUS

## 2019-06-11 MED ORDER — EPHEDRINE 5 MG/ML INJ
INTRAVENOUS | Status: AC
  Filled 2019-06-11: qty 10

## 2019-06-11 MED ORDER — PROPOFOL 10 MG/ML IV BOLUS
INTRAVENOUS | Status: DC | PRN
  Administered 2019-06-11: 20 mg via INTRAVENOUS

## 2019-06-11 MED ORDER — PHENYLEPHRINE 40 MCG/ML (10ML) SYRINGE FOR IV PUSH (FOR BLOOD PRESSURE SUPPORT)
PREFILLED_SYRINGE | INTRAVENOUS | Status: DC | PRN
  Administered 2019-06-11 (×4): 40 ug via INTRAVENOUS

## 2019-06-11 NOTE — Discharge Instructions (Signed)
Colonoscopy Discharge Instructions  Read the instructions outlined below and refer to this sheet in the next few weeks. These discharge instructions provide you with general information on caring for yourself after you leave the hospital. Your doctor may also give you specific instructions. While your treatment has been planned according to the most current medical practices available, unavoidable complications occasionally occur. If you have any problems or questions after discharge, call Dr. Gala Romney at [phone]. ACTIVITY  You may resume your regular activity, but move at a slower pace for the next 24 hours.   Take frequent rest periods for the next 24 hours.   Walking will help get rid of the air and reduce the bloated feeling in your belly (abdomen).   No driving for 24 hours (because of the medicine (anesthesia) used during the test).    Do not sign any important legal documents or operate any machinery for 24 hours (because of the anesthesia used during the test).  NUTRITION  Drink plenty of fluids.   You may resume your normal diet as instructed by your doctor.   Begin with a light meal and progress to your normal diet. Heavy or fried foods are harder to digest and may make you feel sick to your stomach (nauseated).   Avoid alcoholic beverages for 24 hours or as instructed.  MEDICATIONS  You may resume your normal medications unless your doctor tells you otherwise.  WHAT YOU CAN EXPECT TODAY  Some feelings of bloating in the abdomen.   Passage of more gas than usual.   Spotting of blood in your stool or on the toilet paper.  IF YOU HAD POLYPS REMOVED DURING THE COLONOSCOPY:  No aspirin products for 7 days or as instructed.   No alcohol for 7 days or as instructed.   Eat a soft diet for the next 24 hours.  FINDING OUT THE RESULTS OF YOUR TEST Not all test results are available during your visit. If your test results are not back during the visit, make an appointment  with your caregiver to find out the results. Do not assume everything is normal if you have not heard from your caregiver or the medical facility. It is important for you to follow up on all of your test results.  SEEK IMMEDIATE MEDICAL ATTENTION IF:  You have more than a spotting of blood in your stool.   Your belly is swollen (abdominal distention).   You are nauseated or vomiting.   You have a temperature over 101.   You have abdominal pain or discomfort that is severe or gets worse throughout the day.  EGD Discharge instructions Please read the instructions outlined below and refer to this sheet in the next few weeks. These discharge instructions provide you with general information on caring for yourself after you leave the hospital. Your doctor may also give you specific instructions. While your treatment has been planned according to the most current medical practices available, unavoidable complications occasionally occur. If you have any problems or questions after discharge, please call your doctor. ACTIVITY  You may resume your regular activity but move at a slower pace for the next 24 hours.   Take frequent rest periods for the next 24 hours.   Walking will help expel (get rid of) the air and reduce the bloated feeling in your abdomen.   No driving for 24 hours (because of the anesthesia (medicine) used during the test).   You may shower.   Do not sign any important  legal documents or operate any machinery for 24 hours (because of the anesthesia used during the test).  NUTRITION  Drink plenty of fluids.   You may resume your normal diet.   Begin with a light meal and progress to your normal diet.   Avoid alcoholic beverages for 24 hours or as instructed by your caregiver.  MEDICATIONS  You may resume your normal medications unless your caregiver tells you otherwise.  WHAT YOU CAN EXPECT TODAY  You may experience abdominal discomfort such as a feeling of fullness  or "gas" pains.  FOLLOW-UP  Your doctor will discuss the results of your test with you.  SEEK IMMEDIATE MEDICAL ATTENTION IF ANY OF THE FOLLOWING OCCUR:  Excessive nausea (feeling sick to your stomach) and/or vomiting.   Severe abdominal pain and distention (swelling).   Trouble swallowing.   Temperature over 101 F (37.8 C).   Rectal bleeding or vomiting of blood.   GERD information provided  I recommend you take Protonix 40 mg every day where the you feel you needed or not  Repeat colonoscopy for screening purposes in 10 years  Office visit with [place] in 3 months  At patient request, I called destiny, daughter, [phone] left a message on answering service    Gastroesophageal Reflux Disease, Adult Gastroesophageal reflux (GER) happens when acid from the stomach flows up into the tube that connects the mouth and the stomach (esophagus). Normally, food travels down the esophagus and stays in the stomach to be digested. With GER, food and stomach acid sometimes move back up into the esophagus. You may have a disease called gastroesophageal reflux disease (GERD) if the reflux:  Happens often.  Causes frequent or very bad symptoms.  Causes problems such as damage to the esophagus. When this happens, the esophagus becomes sore and swollen (inflamed). Over time, GERD can make small holes (ulcers) in the lining of the esophagus. What are the causes? This condition is caused by a problem with the muscle between the esophagus and the stomach. When this muscle is weak or not normal, it does not close properly to keep food and acid from coming back up from the stomach. The muscle can be weak because of:  Tobacco use.  Pregnancy.  Having a certain type of hernia (hiatal hernia).  Alcohol use.  Certain foods and drinks, such as coffee, chocolate, onions, and peppermint. What increases the risk? You are more likely to develop this condition if you:  Are overweight.  Have a  disease that affects your connective tissue.  Use NSAID medicines. What are the signs or symptoms? Symptoms of this condition include:  Heartburn.  Difficult or painful swallowing.  The feeling of having a lump in the throat.  A bitter taste in the mouth.  Bad breath.  Having a lot of saliva.  Having an upset or bloated stomach.  Belching.  Chest pain. Different conditions can cause chest pain. Make sure you see your doctor if you have chest pain.  Shortness of breath or noisy breathing (wheezing).  Ongoing (chronic) cough or a cough at night.  Wearing away of the surface of teeth (tooth enamel).  Weight loss. How is this treated? Treatment will depend on how bad your symptoms are. Your doctor may suggest:  Changes to your diet.  Medicine.  Surgery. Follow these instructions at home: Eating and drinking   Follow a diet as told by your doctor. You may need to avoid foods and drinks such as: ? Coffee and tea (with  or without caffeine). ? Drinks that contain alcohol. ? Energy drinks and sports drinks. ? Bubbly (carbonated) drinks or sodas. ? Chocolate and cocoa. ? Peppermint and mint flavorings. ? Garlic and onions. ? Horseradish. ? Spicy and acidic foods. These include peppers, chili powder, curry powder, vinegar, hot sauces, and BBQ sauce. ? Citrus fruit juices and citrus fruits, such as oranges, lemons, and limes. ? Tomato-based foods. These include red sauce, chili, salsa, and pizza with red sauce. ? Fried and fatty foods. These include donuts, french fries, potato chips, and high-fat dressings. ? High-fat meats. These include hot dogs, rib eye steak, sausage, ham, and bacon. ? High-fat dairy items, such as whole milk, butter, and cream cheese.  Eat small meals often. Avoid eating large meals.  Avoid drinking large amounts of liquid with your meals.  Avoid eating meals during the 2-3 hours before bedtime.  Avoid lying down right after you eat.  Do  not exercise right after you eat. Lifestyle   Do not use any products that contain nicotine or tobacco. These include cigarettes, e-cigarettes, and chewing tobacco. If you need help quitting, ask your doctor.  Try to lower your stress. If you need help doing this, ask your doctor.  If you are overweight, lose an amount of weight that is healthy for you. Ask your doctor about a safe weight loss goal. General instructions  Pay attention to any changes in your symptoms.  Take over-the-counter and prescription medicines only as told by your doctor. Do not take aspirin, ibuprofen, or other NSAIDs unless your doctor says it is okay.  Wear loose clothes. Do not wear anything tight around your waist.  Raise (elevate) the head of your bed about 6 inches (15 cm).  Avoid bending over if this makes your symptoms worse.  Keep all follow-up visits as told by your doctor. This is important. Contact a doctor if:  You have new symptoms.  You lose weight and you do not know why.  You have trouble swallowing or it hurts to swallow.  You have wheezing or a cough that keeps happening.  Your symptoms do not get better with treatment.  You have a hoarse voice. Get help right away if:  You have pain in your arms, neck, jaw, teeth, or back.  You feel sweaty, dizzy, or light-headed.  You have chest pain or shortness of breath.  You throw up (vomit) and your throw-up looks like blood or coffee grounds.  You pass out (faint).  Your poop (stool) is bloody or black.  You cannot swallow, drink, or eat. Summary  If a person has gastroesophageal reflux disease (GERD), food and stomach acid move back up into the esophagus and cause symptoms or problems such as damage to the esophagus.  Treatment will depend on how bad your symptoms are.  Follow a diet as told by your doctor.  Take all medicines only as told by your doctor. This information is not intended to replace advice given to you by  your health care provider. Make sure you discuss any questions you have with your health care provider. Document Revised: 09/07/2017 Document Reviewed: 09/07/2017 Elsevier Patient Education  2020 [employer]     General Anesthesia, Adult, Care After This sheet gives you information about how to care for yourself after your procedure. Your health care provider may also give you more specific instructions. If you have problems or questions, contact your health care provider. What can I expect after the procedure? After the procedure, the  following side effects are common:  Pain or discomfort at the IV site.  Nausea.  Vomiting.  Sore throat.  Trouble concentrating.  Feeling cold or chills.  Weak or tired.  Sleepiness and fatigue.  Soreness and body aches. These side effects can affect parts of the body that were not involved in surgery. Follow these instructions at home:  For at least 24 hours after the procedure:  Have a responsible adult stay with you. It is important to have someone help care for you until you are awake and alert.  Rest as needed.  Do not: ? Participate in activities in which you could fall or become injured. ? Drive. ? Use heavy machinery. ? Drink alcohol. ? Take sleeping pills or medicines that cause drowsiness. ? Make important decisions or sign legal documents. ? Take care of children on your own. Eating and drinking  Follow any instructions from your health care provider about eating or drinking restrictions.  When you feel hungry, start by eating small amounts of foods that are soft and easy to digest (bland), such as toast. Gradually return to your regular diet.  Drink enough fluid to keep your urine pale yellow.  If you vomit, rehydrate by drinking water, juice, or clear broth. General instructions  If you have sleep apnea, surgery and certain medicines can increase your risk for breathing problems. Follow instructions from your  health care provider about wearing your sleep device: ? Anytime you are sleeping, including during daytime naps. ? While taking prescription pain medicines, sleeping medicines, or medicines that make you drowsy.  Return to your normal activities as told by your health care provider. Ask your health care provider what activities are safe for you.  Take over-the-counter and prescription medicines only as told by your health care provider.  If you smoke, do not smoke without supervision.  Keep all follow-up visits as told by your health care provider. This is important. Contact a health care provider if:  You have nausea or vomiting that does not get better with medicine.  You cannot eat or drink without vomiting.  You have pain that does not get better with medicine.  You are unable to pass urine.  You develop a skin rash.  You have a fever.  You have redness around your IV site that gets worse. Get help right away if:  You have difficulty breathing.  You have chest pain.  You have blood in your urine or stool, or you vomit blood. Summary  After the procedure, it is common to have a sore throat or nausea. It is also common to feel tired.  Have a responsible adult stay with you for the first 24 hours after general anesthesia. It is important to have someone help care for you until you are awake and alert.  When you feel hungry, start by eating small amounts of foods that are soft and easy to digest (bland), such as toast. Gradually return to your regular diet.  Drink enough fluid to keep your urine pale yellow.  Return to your normal activities as told by your health care provider. Ask your health care provider what activities are safe for you. This information is not intended to replace advice given to you by your health care provider. Make sure you discuss any questions you have with your health care provider. Document Revised: 03/04/2017 Document Reviewed:  10/15/2016 Elsevier Patient Education  [place].

## 2019-06-11 NOTE — Op Note (Signed)
[facility] Patient Name: Angela Mccarty Procedure Date: 06/11/2019 9:08 AM MRN: [id number] Date of Birth: Mar 01, 1961 Attending MD: Gennette Pac , MD CSN: [id number] Age: 59 Admit Type: Inpatient Procedure:                Colonoscopy Indications:              Screening for colorectal malignant neoplasm Providers:                Gennette Pac, MD, Jannett Celestine, RN, Pandora Leiter, Technician Referring MD:             Corrie Mckusick MD, MD Medicines:                Propofol per Anesthesia Complications:            No immediate complications. Estimated Blood Loss:     Estimated blood loss: none. Procedure:                Pre-Anesthesia Assessment:                           - Prior to the procedure, a History and Physical                            was performed, and patient medications and                            allergies were reviewed. The patient's tolerance of                            previous anesthesia was also reviewed. The risks                            and benefits of the procedure and the sedation                            options and risks were discussed with the patient.                            All questions were answered, and informed consent                            was obtained. Prior Anticoagulants: The patient has                            taken no previous anticoagulant or antiplatelet                            agents. ASA Grade Assessment: II - A patient with                            mild systemic disease. After reviewing the risks  and benefits, the patient was deemed in                            satisfactory condition to undergo the procedure.                           After obtaining informed consent, the colonoscope                            was passed under direct vision. Throughout the                            procedure, the patient's blood pressure, pulse, and                    oxygen saturations were monitored continuously. The                            [id number]-[id number] ([id number]) scope was introduced through                            the and advanced to the the cecum, identified by                            appendiceal orifice and ileocecal valve. Scope In: 9:12:34 AM Scope Out: 9:30:33 AM Scope Withdrawal Time: 0 hours 11 minutes 17 seconds  Total Procedure Duration: 0 hours 17 minutes 59 seconds  Findings:      The perianal and digital rectal examinations were normal.      The colon (entire examined portion) appeared normal. Colon redundant.       External abdominal pressure required to reach the cecum.      External and internal hemorrhoids were found. The hemorrhoids were       moderate, medium-sized and Grade III (internal hemorrhoids that prolapse       but require manual reduction).      The exam was otherwise without abnormality on direct and retroflexion       views. Impression:               - The entire examined colon is normal (redundant)                           - External and internal hemorrhoids.                           - The examination was otherwise normal on direct                            and retroflexion views.                           - No specimens collected. Moderate Sedation:      Moderate (conscious) sedation was administered by the endoscopy nurse       and supervised by the endoscopist. The following parameters were       monitored: oxygen saturation, heart rate, blood pressure, respiratory       rate, EKG, adequacy of  pulmonary ventilation, and response to care. Recommendation:           - Patient has a contact number available for                            emergencies. The signs and symptoms of potential                            delayed complications were discussed with the                            patient. Return to normal activities tomorrow.                            Written discharge instructions were  provided to the                            patient.                           - Resume previous diet.                           - Continue present medications.                           - Repeat colonoscopy in 10 years for screening                            purposes.                           - Return to GI office in 3 months. See EGD report Procedure Code(s):        --- Professional ---                           [phone], Colonoscopy, flexible; diagnostic, including                            collection of specimen(s) by brushing or washing,                            when performed (separate procedure) Diagnosis Code(s):        --- Professional ---                           Z12.11, Encounter for screening for malignant                            neoplasm of colon                           K64.2, Third degree hemorrhoids CPT copyright 2019 American Medical Association. All rights reserved. The codes documented in this report are preliminary and upon coder review may  be revised to meet current compliance requirements. [name]. [name], MD Gennette Pac, MD 06/11/2019 9:35:05 AM  This report has been signed electronically. Number of Addenda: 0

## 2019-06-11 NOTE — Anesthesia Preprocedure Evaluation (Signed)
Anesthesia Evaluation  Patient identified by MRN, date of birth, ID band Patient awake    Reviewed: Allergy & Precautions, NPO status , Patient's Chart, lab work & pertinent test results  Airway Mallampati: II  TM Distance: >3 FB Neck ROM: Full    Dental  (+) Teeth Intact, Caps, Dental Advisory Given   Pulmonary shortness of breath and with exertion, sleep apnea and Continuous Positive Airway Pressure Ventilation ,    breath sounds clear to auscultation       Cardiovascular METS: 3 - Mets  Rhythm:Regular Rate:Normal     Neuro/Psych    GI/Hepatic GERD  Medicated,  Endo/Other  diabetes (not on meds), Well Controlled, Type 2Hypothyroidism Morbid obesity  Renal/GU      Musculoskeletal  (+) Arthritis , Osteoarthritis,    Abdominal   Peds  Hematology   Anesthesia Other Findings   Reproductive/Obstetrics                            Anesthesia Physical Anesthesia Plan  ASA: III  Anesthesia Plan: General   Post-op Pain Management:    Induction: Intravenous  PONV Risk Score and Plan:   Airway Management Planned: Nasal Cannula, Natural Airway and Simple Face Mask  Additional Equipment:   Intra-op Plan:   Post-operative Plan:   Informed Consent: I have reviewed the patients History and Physical, chart, labs and discussed the procedure including the risks, benefits and alternatives for the proposed anesthesia with the patient or authorized representative who has indicated his/her understanding and acceptance.     Dental advisory given  Plan Discussed with: CRNA  Anesthesia Plan Comments:         Anesthesia Quick Evaluation

## 2019-06-11 NOTE — Transfer of Care (Signed)
Immediate Anesthesia Transfer of Care Note  Patient: Angela Mccarty  Procedure(s) Performed: COLONOSCOPY WITH PROPOFOL (N/A ) ESOPHAGOGASTRODUODENOSCOPY (EGD) WITH PROPOFOL (N/A ) MALONEY DILATION (N/A )  Patient Location: PACU  Anesthesia Type:General  Level of Consciousness: awake  Airway & Oxygen Therapy: Patient Spontanous Breathing  Post-op Assessment: Report given to RN and Post -op Vital signs reviewed and stable  Post vital signs: Reviewed and stable  Last Vitals:  Vitals Value Taken Time  BP    Temp    Pulse 78 06/11/19 0941  Resp    SpO2 97 % 06/11/19 0941  Vitals shown include unvalidated device data.  Last Pain:  Vitals:   06/11/19 0802  TempSrc: Oral  PainSc: 0-No pain      Patients Stated Pain Goal: 7 (06/11/19 0802)  Complications: No apparent anesthesia complications

## 2019-06-11 NOTE — Op Note (Signed)
[facility] Patient Name: Angela Mccarty Procedure Date: 06/11/2019 8:54 AM MRN: [id number] Date of Birth: [birth year] Attending MD: Gennette Pac , MD CSN: [id number] Age: 59 Admit Type: Outpatient Procedure:                Upper GI endoscopy Indications:              Dysphagia Providers:                Gennette Pac, MD, Jannett Celestine, RN, Pandora Leiter, Technician Referring MD:              Medicines:                Propofol per Anesthesia Complications:            No immediate complications. Estimated Blood Loss:     Estimated blood loss: none. Procedure:                Pre-Anesthesia Assessment:                           - Prior to the procedure, a History and Physical                            was performed, and patient medications and                            allergies were reviewed. The patient's tolerance of                            previous anesthesia was also reviewed. The risks                            and benefits of the procedure and the sedation                            options and risks were discussed with the patient.                            All questions were answered, and informed consent                            was obtained. Prior Anticoagulants: The patient has                            taken no previous anticoagulant or antiplatelet                            agents. ASA Grade Assessment: II - A patient with                            mild systemic disease. After reviewing the risks  and benefits, the patient was deemed in                            satisfactory condition to undergo the procedure.                           After obtaining informed consent, the endoscope was                            passed under direct vision. Throughout the                            procedure, the patient's blood pressure, pulse, and                            oxygen saturations were  monitored continuously. The                            [id number]-[id number] ([id number]) scope was introduced through the                            mouth, and advanced to the second part of duodenum.                            The upper GI endoscopy was accomplished without                            difficulty. The patient tolerated the procedure                            well. Scope In: 9:01:45 AM Scope Out: 9:05:47 AM Total Procedure Duration: 0 hours 4 minutes 2 seconds  Findings:      The examined esophagus was normal.      The entire examined stomach was normal.      The duodenal bulb and second portion of the duodenum were normal. The       scope was withdrawn. Dilation was performed with a Maloney dilator with       mild resistance at 56 Fr. The dilation site was examined following       endoscope reinsertion and showed no change. Estimated blood loss: none. Impression:               - Normal esophagus. Dilated.                           - Normal stomach.                           - Normal duodenal bulb and second portion of the                            duodenum.                           - No specimens collected. Moderate Sedation:      Moderate (conscious) sedation was personally administered  by an       anesthesia professional. The following parameters were monitored: oxygen       saturation, heart rate, blood pressure, respiratory rate, EKG, adequacy       of pulmonary ventilation, and response to care. Recommendation:           - Patient has a contact number available for                            emergencies. The signs and symptoms of potential                            delayed complications were discussed with the                            patient. Return to normal activities tomorrow.                            Written discharge instructions were provided to the                            patient.                           - Resume previous diet. Patient has reflux 2-3                             times weekly for which he takes Protonix only on                            demand. Strongly recommend she take Protonix 40 mg                            every day whether she feels she needs it or not.                            This will deal with GERD more appropriately which                            is a chronic disease.                           - Continue present medications.                           - Return to my office in 3 months. See colonoscopy                            report. Procedure Code(s):        --- Professional ---                           [phone], Esophagogastroduodenoscopy, flexible,                            transoral; diagnostic, including collection  of                            specimen(s) by brushing or washing, when performed                            (separate procedure)                           43450, Dilation of esophagus, by unguided sound or                            bougie, single or multiple passes Diagnosis Code(s):        --- Professional ---                           R13.10, Dysphagia, unspecified CPT copyright 2019 American Medical Association. All rights reserved. The codes documented in this report are preliminary and upon coder review may  be revised to meet current compliance requirements. Cristopher Estimable. Myalee Stengel, MD Norvel Richards, MD 06/11/2019 9:11:11 AM This report has been signed electronically. Number of Addenda: 0

## 2019-06-11 NOTE — H&P (Signed)
@LOGO @   Primary Care Physician:  , MD Primary Gastroenterologist:  Dr. Assunta Found  Pre-Procedure History & Physical: HPI:  Angela Mccarty is a 59 y.o. female here for EGD to further evaluate manage esophageal dysphagia and first ever screening colonoscopy.  Protonix 40 mg 2-3 times weekly when she feels reflux.  Past Medical History:  Diagnosis Date  . Arthritis   . Diabetes mellitus   . GERD (gastroesophageal reflux disease)   . Hyperchloremia   . Hypothyroidism   . Obesity   . Sleep apnea with use of continuous positive airway pressure (CPAP)   . Thyroid disease     Past Surgical History:  Procedure Laterality Date  . CATARACT EXTRACTION W/PHACO Left 11/24/2015   Procedure: CATARACT EXTRACTION PHACO AND INTRAOCULAR LENS PLACEMENT (IOC);  Surgeon: 01/24/2016, MD;  Location: [facility];  Service: Ophthalmology;  Laterality: Left;  CDE: 5.53  . CATARACT EXTRACTION W/PHACO Right 02/09/2016   Procedure: CATARACT EXTRACTION PHACO AND INTRAOCULAR LENS PLACEMENT (IOC);  Surgeon: 02/11/2016, MD;  Location: [facility];  Service: Ophthalmology;  Laterality: Right;  CDE: 4.63    Prior to Admission medications   Medication Sig Start Date End Date Taking? Authorizing Provider  Calcium Carb-Cholecalciferol (CALCIUM 600+D3 PO) Take 1 tablet by mouth daily.   Yes [provider]  celecoxib (CELEBREX) 200 MG capsule Take 200 mg by mouth daily.  12/23/18  Yes [provider]  02/22/19 Oil 500 MG CAPS Take 500 mg by mouth 2 (two) times a week.   Yes [provider]  levothyroxine (SYNTHROID, LEVOTHROID) 125 MCG tablet Take 125 mcg by mouth daily. 09/10/14  Yes [provider]  pantoprazole (PROTONIX) 40 MG tablet Take 40 mg by mouth daily as needed (acid reflux/indigestion.).  02/21/19  Yes [provider]  rosuvastatin (CRESTOR) 20 MG tablet Take 20 mg by mouth every Monday, Tuesday, Wednesday, Thursday, and Friday.  04/25/19  Yes [provider]   BLACK ELDERBERRY PO Take 1 tablet by mouth daily.    [provider]  hydrochlorothiazide (HYDRODIURIL) 25 MG tablet TAKE ONE TABLET BY MOUTH ONCE DAILY Patient not taking: Reported on 06/01/2019 11/18/15   01/18/16 A, NP  polyethylene glycol-electrolytes (TRILYTE) 420 g solution Take 4,000 mLs by mouth as directed. 03/21/19   Zilphia Kozinski, 05/19/19, MD  Simethicone (GAS-X EXTRA STRENGTH) 125 MG CAPS Take 125 mg by mouth daily as needed (GAS).    [provider]  traMADol-acetaminophen (ULTRACET) 37.5-325 MG tablet Take 1 tablet by mouth every 6 (six) hours as needed (pain).    [provider]    Allergies as of 03/21/2019 - Review Complete 03/21/2019  Allergen Reaction Noted  . Nabumetone Hives, Shortness Of Breath, and Itching 01/26/2011    Family History  Problem Relation Age of Onset  . Cancer Mother        ovarian, lung, liver  . Diabetes Mother   . Hypertension Father   . Glaucoma Father   . Hypertension Sister   . Hypertension Brother   . Cancer Paternal Aunt        breast  . Cancer Paternal Uncle   . Diabetes Maternal Grandmother   . Thyroid disease Sister   . Hypertension Sister   . Hypertension Brother   . Colon cancer Neg Hx   . Gastric cancer Neg Hx   . Esophageal cancer Neg Hx     Social History   Socioeconomic History  . Marital status: Widowed    Spouse  name: Not on file  . Number of children: Not on file  . Years of education: Not on file  . Highest education level: Not on file  Occupational History  . Not on file  Tobacco Use  . Smoking status: Never Smoker  . Smokeless tobacco: Never Used  Substance and Sexual Activity  . Alcohol use: No  . Drug use: No  . Sexual activity: Not Currently    Birth control/protection: Post-menopausal  Other Topics Concern  . Not on file  Social History Narrative  . Not on file   Social Determinants of Health   Financial Resource Strain:   . Difficulty of Paying Living Expenses:    Food Insecurity:   . Worried About Charity fundraiser in the Last Year:   . Arboriculturist in the Last Year:   Transportation Needs:   . Film/video editor (Medical):   Marland Kitchen Lack of Transportation (Non-Medical):   Physical Activity:   . Days of Exercise per Week:   . Minutes of Exercise per Session:   Stress:   . Feeling of Stress :   Social Connections:   . Frequency of Communication with Friends and Family:   . Frequency of Social Gatherings with Friends and Family:   . Attends Religious Services:   . Active Member of Clubs or Organizations:   . Attends Archivist Meetings:   Marland Kitchen Marital Status:   Intimate Partner Violence:   . Fear of Current or Ex-Partner:   . Emotionally Abused:   Marland Kitchen Physically Abused:   . Sexually Abused:     Review of Systems: See HPI, otherwise negative ROS  Physical Exam: BP (!) 158/96   Pulse 79   Temp 99 F (37.2 C) (Oral)   Resp (!) 22   SpO2 98%  General:   Alert,  Well-developed, well-nourished, pleasant and cooperative in NAD Neck:  Supple; no masses or thyromegaly. No significant cervical adenopathy. Lungs:  Clear throughout to auscultation.   No wheezes, crackles, or rhonchi. No acute distress. Heart:  Regular rate and rhythm; no murmurs, clicks, rubs,  or gallops. Abdomen: Obese.  Non-distended, normal bowel sounds.  Soft and nontender without appreciable mass or hepatosplenomegaly.  Pulses:  Normal pulses noted. Extremities:  Without clubbing or edema.  Impression/Plan: 59 year old lady with GERD and esophageal dysphagia here for EGD with esophageal dilation as feasible/appropriate per plan.  Also here for first ever screening colonoscopy. The risks, benefits, limitations, imponderables and alternatives regarding both EGD and colonoscopy have been reviewed with the patient. Questions have been answered. All parties agreeable.      Notice: This dictation was prepared with Dragon dictation along with smaller phrase  technology. Any transcriptional errors that result from this process are unintentional and may not be corrected upon review.

## 2019-06-11 NOTE — Anesthesia Postprocedure Evaluation (Signed)
Anesthesia Post Note  Patient: Angela Mccarty  Procedure(s) Performed: COLONOSCOPY WITH PROPOFOL (N/A ) ESOPHAGOGASTRODUODENOSCOPY (EGD) WITH PROPOFOL (N/A ) MALONEY DILATION (N/A )  Patient location during evaluation: PACU Anesthesia Type: General Level of consciousness: awake and alert Pain management: pain level controlled Vital Signs Assessment: post-procedure vital signs reviewed and stable Respiratory status: spontaneous breathing, nonlabored ventilation and respiratory function stable Cardiovascular status: stable Postop Assessment: no apparent nausea or vomiting Anesthetic complications: no     Last Vitals:  Vitals:   06/11/19 0802  BP: (!) 158/96  Pulse: 79  Resp: (!) 22  Temp: 37.2 C  SpO2: 98%    Last Pain:  Vitals:   06/11/19 0802  TempSrc: Oral  PainSc: 0-No pain                 Lucius Wise Hristova

## 2019-07-25 ENCOUNTER — Telehealth: Payer: BC Managed Care – PPO | Admitting: Nurse Practitioner

## 2019-08-02 ENCOUNTER — Telehealth (INDEPENDENT_AMBULATORY_CARE_PROVIDER_SITE_OTHER): Payer: BC Managed Care – PPO | Admitting: Nurse Practitioner

## 2019-08-02 ENCOUNTER — Encounter: Payer: Self-pay | Admitting: Nurse Practitioner

## 2019-08-02 ENCOUNTER — Other Ambulatory Visit: Payer: Self-pay

## 2019-08-02 DIAGNOSIS — R1319 Other dysphagia: Secondary | ICD-10-CM

## 2019-08-02 DIAGNOSIS — K219 Gastro-esophageal reflux disease without esophagitis: Secondary | ICD-10-CM

## 2019-08-02 DIAGNOSIS — R1310 Dysphagia, unspecified: Secondary | ICD-10-CM | POA: Diagnosis not present

## 2019-08-02 NOTE — Assessment & Plan Note (Addendum)
Chronic GERD symptoms, was on Protonix as needed which was causing intermittent flares about twice a week.  Also associated with bloating for which she was taking Gas-X, which is also resolved and no longer needing Gas-X.  Not she is on Protonix once daily her symptoms are improved/essentially resolved.  Recommend she continue her current medications and follow-up in 1 year.  Call for any worsening symptoms before then.

## 2019-08-02 NOTE — Progress Notes (Signed)
Referring Provider: Assunta Found, MD Primary Care Physician:  Assunta Found, MD Primary GI:  Dr. [name]   NOTE: Service was provided via telemedicine and was requested by the patient due to COVID-19 pandemic.  Patient Location: Home  Provider Location: [facility] office  Reason for Phone Visit: Follow-up  Persons present on the phone encounter, with roles: Patient, myself (provider), Mindy Estudillo (updated meds and allergies)  Total time (minutes) spent on medical discussion: 23 minutes  Due to COVID-19, visit was conducted using the virtual method noted. Visit was requested by patient.  I connected with Angela Mccarty on 08/02/19 at  8:00 AM EDT by video call and verified that I am speaking with the correct person using two identifiers.   I discussed the limitations, risks, security and privacy concerns of performing an evaluation and management service by telephone and the availability of in person appointments. I also discussed with the patient that there may be a patient responsible charge related to this service. The patient expressed understanding and agreed to proceed.  Chief Complaint  Patient presents with  . Dysphagia    doing fine. Everything has been fine    HPI:   Angela Mccarty is a 59 y.o. female who presents for virtual visit regarding: Follow-up on dysphagia and to schedule a colonoscopy.  Patient was last seen in our office 03/21/2019 for dysphagia and to schedule colonoscopy.  Her last visit was a virtual visit due to COVID-19/coronavirus pandemic.  Previously complained of GI of GERD-like symptoms for 1 to 2 months for which she was placed on a PPI, Gas-X, and referred to GI.  At her last visit she noted no previous colonoscopy.  GERD and solid food dysphagia occasionally fluids as well but much less often.  Solid food dysphagia occurs about once a day.  No ongoing GERD symptoms but does have bloating.  Daily [place] 4 bowel movements.  Not sensation in her upper  abdomen when she bends forward.  No other overt GI complaints.  Recommended colonoscopy, EGD with possible dilation, continue PPI, follow-up in 4 months.  Colonoscopy was completed 06/11/2019 which found entire colon normal, internal and external hemorrhoids.  Recommended repeat colonoscopy in 10 years.  Follow-up in 3 months.  EGD completed the same day found normal esophagus status post dilation, normal stomach and duodenum.  Recommended she take Protonix daily rather than as needed which she was currently doing.  Follow-up in 3 months.   Today she states she's doing well overall. Denies any further dysphagia symptoms. GERD symptoms resolved on daily Protonix. Denies abdominal pain, N/V, hematochezia, melena, fever, chills, unintentional weight loss. Denies URI or flu-like symptoms. Denies loss of sense of taste or smell. The patient has not received COVID-19 vaccination(s). They are not interested in scheduling information. Denies chest pain, dyspnea, dizziness, lightheadedness, syncope, near syncope. Denies any other upper or lower GI symptoms.  Past Medical History:  Diagnosis Date  . Arthritis   . Diabetes mellitus   . GERD (gastroesophageal reflux disease)   . Hyperchloremia   . Hypothyroidism   . Obesity   . Sleep apnea with use of continuous positive airway pressure (CPAP)   . Thyroid disease     Past Surgical History:  Procedure Laterality Date  . CATARACT EXTRACTION W/PHACO Left 11/24/2015   Procedure: CATARACT EXTRACTION PHACO AND INTRAOCULAR LENS PLACEMENT (IOC);  Surgeon: Gemma Payor, MD;  Location: [facility];  Service: Ophthalmology;  Laterality: Left;  CDE: 5.53  . CATARACT EXTRACTION W/PHACO Right  02/09/2016   Procedure: CATARACT EXTRACTION PHACO AND INTRAOCULAR LENS PLACEMENT (IOC);  Surgeon: Gemma Payor, MD;  Location: [facility];  Service: Ophthalmology;  Laterality: Right;  CDE: 4.63  . COLONOSCOPY WITH PROPOFOL N/A 06/11/2019   Procedure: COLONOSCOPY WITH PROPOFOL;  Surgeon:  Corbin Ade, MD;  Location: [facility];  Service: Endoscopy;  Laterality: N/A;  8:45am  . ESOPHAGOGASTRODUODENOSCOPY (EGD) WITH PROPOFOL N/A 06/11/2019   Procedure: ESOPHAGOGASTRODUODENOSCOPY (EGD) WITH PROPOFOL;  Surgeon: Corbin Ade, MD;  Location: [facility];  Service: Endoscopy;  Laterality: N/A;  Elease Hashimoto DILATION N/A 06/11/2019   Procedure: Elease Hashimoto DILATION;  Surgeon: Corbin Ade, MD;  Location: [facility];  Service: Endoscopy;  Laterality: N/A;    Current Outpatient Medications  Medication Sig Dispense Refill  . celecoxib (CELEBREX) 200 MG capsule Take 200 mg by mouth daily.     Boris Lown Oil 500 MG CAPS Take 500 mg by mouth 2 (two) times a week.    . levothyroxine (SYNTHROID, LEVOTHROID) 125 MCG tablet Take 125 mcg by mouth daily.  0  . pantoprazole (PROTONIX) 40 MG tablet Take 40 mg by mouth daily as needed (acid reflux/indigestion.).     Marland Kitchen rosuvastatin (CRESTOR) 20 MG tablet Take 20 mg by mouth every Monday, Tuesday, Wednesday, Thursday, and Friday.     . traMADol-acetaminophen (ULTRACET) 37.5-325 MG tablet Take 1 tablet by mouth every 6 (six) hours as needed (pain).    Marland Kitchen BLACK ELDERBERRY PO Take 1 tablet by mouth daily.    . Calcium Carb-Cholecalciferol (CALCIUM 600+D3 PO) Take 1 tablet by mouth daily.    . Simethicone (GAS-X EXTRA STRENGTH) 125 MG CAPS Take 125 mg by mouth daily as needed (GAS).     No current facility-administered medications for this visit.    Allergies as of 08/02/2019 - Review Complete 08/02/2019  Allergen Reaction Noted  . Nabumetone Hives, Shortness Of Breath, and Itching 01/26/2011    Family History  Problem Relation Age of Onset  . Cancer Mother        ovarian, lung, liver  . Diabetes Mother   . Hypertension Father   . Glaucoma Father   . Hypertension Sister   . Hypertension Brother   . Cancer Paternal Aunt        breast  . Cancer Paternal Uncle   . Diabetes Maternal Grandmother   . Thyroid disease Sister   . Hypertension  Sister   . Hypertension Brother   . Colon cancer Neg Hx   . Gastric cancer Neg Hx   . Esophageal cancer Neg Hx     Social History   Socioeconomic History  . Marital status: Widowed    Spouse name: Not on file  . Number of children: Not on file  . Years of education: Not on file  . Highest education level: Not on file  Occupational History  . Not on file  Tobacco Use  . Smoking status: Never Smoker  . Smokeless tobacco: Never Used  Substance and Sexual Activity  . Alcohol use: No  . Drug use: No  . Sexual activity: Not Currently    Birth control/protection: Post-menopausal  Other Topics Concern  . Not on file  Social History Narrative  . Not on file   Social Determinants of Health   Financial Resource Strain:   . Difficulty of Paying Living Expenses:   Food Insecurity:   . Worried About Programme researcher, broadcasting/film/video in the Last Year:   . [employer]  in the Last Year:   Transportation Needs:   . Film/video editor (Medical):   Marland Kitchen Lack of Transportation (Non-Medical):   Physical Activity:   . Days of Exercise per Week:   . Minutes of Exercise per Session:   Stress:   . Feeling of Stress :   Social Connections:   . Frequency of Communication with Friends and Family:   . Frequency of Social Gatherings with Friends and Family:   . Attends Religious Services:   . Active Member of Clubs or Organizations:   . Attends Archivist Meetings:   Marland Kitchen Marital Status:     Review of Systems: Review of Systems  Constitutional: Negative for chills, fever, malaise/fatigue and weight loss.  HENT: Negative for congestion and sore throat.   Respiratory: Negative for cough and shortness of breath.   Cardiovascular: Negative for chest pain and palpitations.  Gastrointestinal: Negative for abdominal pain, blood in stool, diarrhea, melena, nausea and vomiting.  Musculoskeletal: Positive for joint pain (arthritis history). Negative for myalgias.  Skin: Negative for rash.    Neurological: Negative for dizziness and weakness.  Endo/Heme/Allergies: Does not bruise/bleed easily.  Psychiatric/Behavioral: Negative for depression. The patient is not nervous/anxious.   All other systems reviewed and are negative.   Physical Exam: Note: limited exam due to virtual visit There were no vitals taken for this visit. Physical Exam Nursing note reviewed.  Constitutional:      General: She is not in acute distress.    Appearance: Normal appearance. She is well-developed. She is not ill-appearing, toxic-appearing or diaphoretic.  HENT:     Head: Normocephalic and atraumatic.     Nose: No congestion or rhinorrhea.  Eyes:     General: No scleral icterus. Pulmonary:     Effort: Pulmonary effort is normal. No respiratory distress.  Abdominal:     General: There is no distension.     Palpations: There is no hepatomegaly or splenomegaly.  Skin:    Coloration: Skin is not jaundiced.     Findings: No rash.  Neurological:     General: No focal deficit present.     Mental Status: She is alert and oriented to person, place, and time.  Psychiatric:        Attention and Perception: Attention normal.        Mood and Affect: Mood normal.        Speech: Speech normal.        Behavior: Behavior normal.        Thought Content: Thought content normal.        Cognition and Memory: Cognition and memory normal.

## 2019-08-02 NOTE — Patient Instructions (Signed)
Your health issues we discussed today were:   GERD (reflux/heartburn): 1. Glad you are doing better! 2. Continue taking Protonix once a day to prevent further GERD symptoms 3. Call us if you have any worsening or severe symptoms  Dysphagia (swallowing difficulties): 1. Keeping your GERD symptoms under control should help prevent further swallowing issues 2. If you develop further swallowing issues in the future, please call our office and let us know  Colonoscopy: 1. As we discussed, your colonoscopy was normal 2. You will be due for repeat in 10 years (2031) 3. Call for any significant symptoms such as rectal bleeding  Overall I recommend:  1. Continue your other current medications 2. Return for follow-up in 1 year 3. Call us if you have any questions or concerns   At [facility] we value your feedback. You may receive a survey about your visit today. Please share your experience as we strive to create trusting relationships with our patients to provide genuine, compassionate, quality care.  We appreciate your understanding and patience as we review any laboratory studies, imaging, and other diagnostic tests that are ordered as we care for you. Our office policy is 5 business days for review of these results, and any emergent or urgent results are addressed in a timely manner for your best interest. If you do not hear from our office in 1 week, please contact us.   We also encourage the use of MyChart, which contains your medical information for your review as well. If you are not enrolled in this feature, an access code is on this after visit summary for your convenience. Thank you for allowing [place] to be involved in your care.  It was great to see you today!  I hope you have a great Summer and HAPPY BIRTHDAY!!!!

## 2019-08-02 NOTE — Assessment & Plan Note (Signed)
Dysphagia symptoms resolved at this time status post EGD with dilation and on daily Protonix.  Recommend she continue her current medications.  Follow-up in 1 year.  Call us for any worsening or severe symptoms.

## 2019-08-08 ENCOUNTER — Telehealth: Payer: Self-pay | Admitting: *Deleted

## 2019-08-08 NOTE — Telephone Encounter (Signed)
Angela Mccarty, you are scheduled for a virtual visit with your provider today.  Just as we do with appointments in the office, we must obtain your consent to participate.  Your consent will be active for this visit and any virtual visit you may have with one of our providers in the next 365 days.  If you have a MyChart account, I can also send a copy of this consent to you electronically.  All virtual visits are billed to your insurance company just like a traditional visit in the office.  As this is a virtual visit, video technology does not allow for your provider to perform a traditional examination.  This may limit your provider's ability to fully assess your condition.  If your provider identifies any concerns that need to be evaluated in person or the need to arrange testing such as labs, EKG, etc, we will make arrangements to do so.  Although advances in technology are sophisticated, we cannot ensure that it will always work on either your end or our end.  If the connection with a video visit is poor, we may have to switch to a telephone visit.  With either a video or telephone visit, we are not always able to ensure that we have a secure connection.   I need to obtain your verbal consent now.   Are you willing to proceed with your visit today?

## 2019-08-08 NOTE — Telephone Encounter (Signed)
Pt consented to virtual visit on 08/02/19. 

## 2019-09-27 DIAGNOSIS — G4733 Obstructive sleep apnea (adult) (pediatric): Secondary | ICD-10-CM | POA: Diagnosis not present

## 2019-11-21 DIAGNOSIS — E1140 Type 2 diabetes mellitus with diabetic neuropathy, unspecified: Secondary | ICD-10-CM | POA: Diagnosis not present

## 2019-11-21 DIAGNOSIS — E7849 Other hyperlipidemia: Secondary | ICD-10-CM | POA: Diagnosis not present

## 2019-11-21 DIAGNOSIS — I10 Essential (primary) hypertension: Secondary | ICD-10-CM | POA: Diagnosis not present

## 2019-11-21 DIAGNOSIS — E039 Hypothyroidism, unspecified: Secondary | ICD-10-CM | POA: Diagnosis not present

## 2019-11-21 DIAGNOSIS — Z6841 Body Mass Index (BMI) 40.0 and over, adult: Secondary | ICD-10-CM | POA: Diagnosis not present

## 2020-03-12 DIAGNOSIS — E1140 Type 2 diabetes mellitus with diabetic neuropathy, unspecified: Secondary | ICD-10-CM | POA: Diagnosis not present

## 2020-03-12 DIAGNOSIS — I10 Essential (primary) hypertension: Secondary | ICD-10-CM | POA: Diagnosis not present

## 2020-03-12 DIAGNOSIS — Z6841 Body Mass Index (BMI) 40.0 and over, adult: Secondary | ICD-10-CM | POA: Diagnosis not present

## 2020-03-12 DIAGNOSIS — Z1231 Encounter for screening mammogram for malignant neoplasm of breast: Secondary | ICD-10-CM | POA: Diagnosis not present

## 2020-03-24 DIAGNOSIS — E7849 Other hyperlipidemia: Secondary | ICD-10-CM | POA: Diagnosis not present

## 2020-04-01 DIAGNOSIS — G4733 Obstructive sleep apnea (adult) (pediatric): Secondary | ICD-10-CM | POA: Diagnosis not present

## 2020-06-16 ENCOUNTER — Encounter: Payer: Self-pay | Admitting: Internal Medicine

## 2020-09-24 DIAGNOSIS — E119 Type 2 diabetes mellitus without complications: Secondary | ICD-10-CM | POA: Diagnosis not present

## 2020-09-24 DIAGNOSIS — Z1389 Encounter for screening for other disorder: Secondary | ICD-10-CM | POA: Diagnosis not present

## 2020-09-24 DIAGNOSIS — E782 Mixed hyperlipidemia: Secondary | ICD-10-CM | POA: Diagnosis not present

## 2020-09-24 DIAGNOSIS — T466X5D Adverse effect of antihyperlipidemic and antiarteriosclerotic drugs, subsequent encounter: Secondary | ICD-10-CM | POA: Diagnosis not present

## 2020-09-24 DIAGNOSIS — Z1331 Encounter for screening for depression: Secondary | ICD-10-CM | POA: Diagnosis not present

## 2020-09-24 DIAGNOSIS — Z23 Encounter for immunization: Secondary | ICD-10-CM | POA: Diagnosis not present

## 2020-09-24 DIAGNOSIS — M1711 Unilateral primary osteoarthritis, right knee: Secondary | ICD-10-CM | POA: Diagnosis not present

## 2020-09-24 DIAGNOSIS — E1140 Type 2 diabetes mellitus with diabetic neuropathy, unspecified: Secondary | ICD-10-CM | POA: Diagnosis not present

## 2020-09-24 DIAGNOSIS — E039 Hypothyroidism, unspecified: Secondary | ICD-10-CM | POA: Diagnosis not present

## 2020-09-24 DIAGNOSIS — F419 Anxiety disorder, unspecified: Secondary | ICD-10-CM | POA: Diagnosis not present

## 2020-09-24 DIAGNOSIS — Z6841 Body Mass Index (BMI) 40.0 and over, adult: Secondary | ICD-10-CM | POA: Diagnosis not present

## 2020-11-01 DIAGNOSIS — G4733 Obstructive sleep apnea (adult) (pediatric): Secondary | ICD-10-CM | POA: Diagnosis not present

## 2021-02-10 DIAGNOSIS — M1991 Primary osteoarthritis, unspecified site: Secondary | ICD-10-CM | POA: Diagnosis not present

## 2021-02-10 DIAGNOSIS — E1140 Type 2 diabetes mellitus with diabetic neuropathy, unspecified: Secondary | ICD-10-CM | POA: Diagnosis not present

## 2021-02-10 DIAGNOSIS — G4700 Insomnia, unspecified: Secondary | ICD-10-CM | POA: Diagnosis not present

## 2021-02-10 DIAGNOSIS — Z6841 Body Mass Index (BMI) 40.0 and over, adult: Secondary | ICD-10-CM | POA: Diagnosis not present

## 2021-02-10 DIAGNOSIS — R609 Edema, unspecified: Secondary | ICD-10-CM | POA: Diagnosis not present

## 2021-04-03 DIAGNOSIS — Z1231 Encounter for screening mammogram for malignant neoplasm of breast: Secondary | ICD-10-CM | POA: Diagnosis not present

## 2021-06-08 DIAGNOSIS — R079 Chest pain, unspecified: Secondary | ICD-10-CM | POA: Diagnosis not present

## 2021-06-08 DIAGNOSIS — M549 Dorsalgia, unspecified: Secondary | ICD-10-CM | POA: Diagnosis not present

## 2021-06-08 DIAGNOSIS — Z6841 Body Mass Index (BMI) 40.0 and over, adult: Secondary | ICD-10-CM | POA: Diagnosis not present

## 2021-06-08 DIAGNOSIS — M1991 Primary osteoarthritis, unspecified site: Secondary | ICD-10-CM | POA: Diagnosis not present

## 2021-06-09 ENCOUNTER — Ambulatory Visit: Payer: BC Managed Care – PPO | Admitting: Cardiology

## 2021-11-02 DIAGNOSIS — M1991 Primary osteoarthritis, unspecified site: Secondary | ICD-10-CM | POA: Diagnosis not present

## 2021-11-02 DIAGNOSIS — F419 Anxiety disorder, unspecified: Secondary | ICD-10-CM | POA: Diagnosis not present

## 2021-11-02 DIAGNOSIS — E1140 Type 2 diabetes mellitus with diabetic neuropathy, unspecified: Secondary | ICD-10-CM | POA: Diagnosis not present

## 2021-11-02 DIAGNOSIS — J209 Acute bronchitis, unspecified: Secondary | ICD-10-CM | POA: Diagnosis not present

## 2021-11-02 DIAGNOSIS — Z6841 Body Mass Index (BMI) 40.0 and over, adult: Secondary | ICD-10-CM | POA: Diagnosis not present

## 2021-11-02 DIAGNOSIS — J329 Chronic sinusitis, unspecified: Secondary | ICD-10-CM | POA: Diagnosis not present

## 2021-11-03 DIAGNOSIS — G4733 Obstructive sleep apnea (adult) (pediatric): Secondary | ICD-10-CM | POA: Diagnosis not present

## 2021-12-14 DIAGNOSIS — E039 Hypothyroidism, unspecified: Secondary | ICD-10-CM | POA: Diagnosis not present

## 2021-12-14 DIAGNOSIS — R609 Edema, unspecified: Secondary | ICD-10-CM | POA: Diagnosis not present

## 2021-12-14 DIAGNOSIS — Z0001 Encounter for general adult medical examination with abnormal findings: Secondary | ICD-10-CM | POA: Diagnosis not present

## 2021-12-14 DIAGNOSIS — E1140 Type 2 diabetes mellitus with diabetic neuropathy, unspecified: Secondary | ICD-10-CM | POA: Diagnosis not present

## 2022-04-09 DIAGNOSIS — R92313 Mammographic fatty tissue density, bilateral breasts: Secondary | ICD-10-CM | POA: Diagnosis not present

## 2022-04-09 DIAGNOSIS — Z1231 Encounter for screening mammogram for malignant neoplasm of breast: Secondary | ICD-10-CM | POA: Diagnosis not present

## 2022-04-16 DIAGNOSIS — Z6841 Body Mass Index (BMI) 40.0 and over, adult: Secondary | ICD-10-CM | POA: Diagnosis not present

## 2022-04-16 DIAGNOSIS — E1140 Type 2 diabetes mellitus with diabetic neuropathy, unspecified: Secondary | ICD-10-CM | POA: Diagnosis not present

## 2022-04-16 DIAGNOSIS — F419 Anxiety disorder, unspecified: Secondary | ICD-10-CM | POA: Diagnosis not present

## 2022-04-16 DIAGNOSIS — B354 Tinea corporis: Secondary | ICD-10-CM | POA: Diagnosis not present

## 2022-06-14 DIAGNOSIS — E782 Mixed hyperlipidemia: Secondary | ICD-10-CM | POA: Diagnosis not present

## 2022-06-14 DIAGNOSIS — E039 Hypothyroidism, unspecified: Secondary | ICD-10-CM | POA: Diagnosis not present

## 2022-06-14 DIAGNOSIS — Z6841 Body Mass Index (BMI) 40.0 and over, adult: Secondary | ICD-10-CM | POA: Diagnosis not present

## 2022-06-14 DIAGNOSIS — E1140 Type 2 diabetes mellitus with diabetic neuropathy, unspecified: Secondary | ICD-10-CM | POA: Diagnosis not present

## 2022-08-05 DIAGNOSIS — M1711 Unilateral primary osteoarthritis, right knee: Secondary | ICD-10-CM | POA: Diagnosis not present

## 2022-08-05 DIAGNOSIS — Z6841 Body Mass Index (BMI) 40.0 and over, adult: Secondary | ICD-10-CM | POA: Diagnosis not present

## 2022-11-09 DIAGNOSIS — U071 COVID-19: Secondary | ICD-10-CM | POA: Diagnosis not present

## 2022-11-09 DIAGNOSIS — Z20828 Contact with and (suspected) exposure to other viral communicable diseases: Secondary | ICD-10-CM | POA: Diagnosis not present

## 2022-11-09 DIAGNOSIS — Z6841 Body Mass Index (BMI) 40.0 and over, adult: Secondary | ICD-10-CM | POA: Diagnosis not present

## 2022-11-09 DIAGNOSIS — R6889 Other general symptoms and signs: Secondary | ICD-10-CM | POA: Diagnosis not present

## 2023-02-09 DIAGNOSIS — Z6838 Body mass index (BMI) 38.0-38.9, adult: Secondary | ICD-10-CM | POA: Diagnosis not present

## 2023-02-09 DIAGNOSIS — G4700 Insomnia, unspecified: Secondary | ICD-10-CM | POA: Diagnosis not present

## 2023-02-09 DIAGNOSIS — R6889 Other general symptoms and signs: Secondary | ICD-10-CM | POA: Diagnosis not present

## 2023-02-09 DIAGNOSIS — E782 Mixed hyperlipidemia: Secondary | ICD-10-CM | POA: Diagnosis not present

## 2023-02-09 DIAGNOSIS — Z0001 Encounter for general adult medical examination with abnormal findings: Secondary | ICD-10-CM | POA: Diagnosis not present

## 2023-02-09 DIAGNOSIS — K600 Acute anal fissure: Secondary | ICD-10-CM | POA: Diagnosis not present

## 2023-02-09 DIAGNOSIS — Z9229 Personal history of other drug therapy: Secondary | ICD-10-CM | POA: Diagnosis not present

## 2023-02-09 DIAGNOSIS — Z1331 Encounter for screening for depression: Secondary | ICD-10-CM | POA: Diagnosis not present

## 2023-02-09 DIAGNOSIS — M1991 Primary osteoarthritis, unspecified site: Secondary | ICD-10-CM | POA: Diagnosis not present

## 2023-02-09 DIAGNOSIS — E1140 Type 2 diabetes mellitus with diabetic neuropathy, unspecified: Secondary | ICD-10-CM | POA: Diagnosis not present

## 2023-04-13 DIAGNOSIS — Z1231 Encounter for screening mammogram for malignant neoplasm of breast: Secondary | ICD-10-CM | POA: Diagnosis not present

## 2023-04-13 DIAGNOSIS — R92313 Mammographic fatty tissue density, bilateral breasts: Secondary | ICD-10-CM | POA: Diagnosis not present

## 2023-06-22 DIAGNOSIS — Z9229 Personal history of other drug therapy: Secondary | ICD-10-CM | POA: Diagnosis not present

## 2023-06-22 DIAGNOSIS — F419 Anxiety disorder, unspecified: Secondary | ICD-10-CM | POA: Diagnosis not present

## 2023-06-22 DIAGNOSIS — E039 Hypothyroidism, unspecified: Secondary | ICD-10-CM | POA: Diagnosis not present

## 2023-06-22 DIAGNOSIS — Z6837 Body mass index (BMI) 37.0-37.9, adult: Secondary | ICD-10-CM | POA: Diagnosis not present

## 2023-06-22 DIAGNOSIS — E782 Mixed hyperlipidemia: Secondary | ICD-10-CM | POA: Diagnosis not present

## 2023-06-22 DIAGNOSIS — E1140 Type 2 diabetes mellitus with diabetic neuropathy, unspecified: Secondary | ICD-10-CM | POA: Diagnosis not present

## 2023-06-22 DIAGNOSIS — G4700 Insomnia, unspecified: Secondary | ICD-10-CM | POA: Diagnosis not present

## 2023-11-16 DIAGNOSIS — E7849 Other hyperlipidemia: Secondary | ICD-10-CM | POA: Diagnosis not present

## 2023-11-16 DIAGNOSIS — E6609 Other obesity due to excess calories: Secondary | ICD-10-CM | POA: Diagnosis not present

## 2023-11-16 DIAGNOSIS — E1140 Type 2 diabetes mellitus with diabetic neuropathy, unspecified: Secondary | ICD-10-CM | POA: Diagnosis not present

## 2023-11-16 DIAGNOSIS — F419 Anxiety disorder, unspecified: Secondary | ICD-10-CM | POA: Diagnosis not present

## 2023-11-16 DIAGNOSIS — J329 Chronic sinusitis, unspecified: Secondary | ICD-10-CM | POA: Diagnosis not present

## 2023-11-16 DIAGNOSIS — Z6835 Body mass index (BMI) 35.0-35.9, adult: Secondary | ICD-10-CM | POA: Diagnosis not present

## 2023-11-16 DIAGNOSIS — E039 Hypothyroidism, unspecified: Secondary | ICD-10-CM | POA: Diagnosis not present

## 2024-02-15 DIAGNOSIS — E785 Hyperlipidemia, unspecified: Secondary | ICD-10-CM | POA: Diagnosis not present

## 2024-02-15 DIAGNOSIS — Z23 Encounter for immunization: Secondary | ICD-10-CM | POA: Diagnosis not present

## 2024-02-15 DIAGNOSIS — J309 Allergic rhinitis, unspecified: Secondary | ICD-10-CM | POA: Diagnosis not present

## 2024-02-15 DIAGNOSIS — E039 Hypothyroidism, unspecified: Secondary | ICD-10-CM | POA: Diagnosis not present

## 2024-02-15 DIAGNOSIS — Z0000 Encounter for general adult medical examination without abnormal findings: Secondary | ICD-10-CM | POA: Diagnosis not present

## 2024-02-15 DIAGNOSIS — E1140 Type 2 diabetes mellitus with diabetic neuropathy, unspecified: Secondary | ICD-10-CM | POA: Diagnosis not present
